# Patient Record
Sex: Female | Born: 1990 | Race: White | Hispanic: No | Smoking: Current every day smoker
Health system: Southern US, Community
[De-identification: ages and names within clinical notes are randomized; demographics above are authoritative.]

## PROBLEM LIST (undated history)

## (undated) DIAGNOSIS — I10 Essential (primary) hypertension: Secondary | ICD-10-CM

## (undated) DIAGNOSIS — F1110 Opioid abuse, uncomplicated: Secondary | ICD-10-CM

## (undated) DIAGNOSIS — F319 Bipolar disorder, unspecified: Secondary | ICD-10-CM

## (undated) DIAGNOSIS — F419 Anxiety disorder, unspecified: Secondary | ICD-10-CM

## (undated) DIAGNOSIS — N289 Disorder of kidney and ureter, unspecified: Secondary | ICD-10-CM

---
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
## 2010-03-13 ENCOUNTER — Encounter: Payer: Self-pay | Admitting: Gastroenterology

## 2014-02-20 HISTORY — PX: KIDNEY SURGERY: SHX687

## 2016-12-09 ENCOUNTER — Encounter: Payer: Self-pay | Admitting: Emergency Medicine

## 2016-12-09 ENCOUNTER — Emergency Department (HOSPITAL_COMMUNITY)
Admission: EM | Admit: 2016-12-09 | Discharge: 2016-12-11 | Disposition: A | Discharge status: Other Acute Inpatient Hospital | Payer: Medicaid - Out of State | Attending: Emergency Medicine | Admitting: Emergency Medicine

## 2016-12-09 DIAGNOSIS — Z3A28 28 weeks gestation of pregnancy: Secondary | ICD-10-CM | POA: Diagnosis not present

## 2016-12-09 DIAGNOSIS — Z046 Encounter for general psychiatric examination, requested by authority: Secondary | ICD-10-CM | POA: Insufficient documentation

## 2016-12-09 DIAGNOSIS — O99323 Drug use complicating pregnancy, third trimester: Secondary | ICD-10-CM | POA: Diagnosis not present

## 2016-12-09 DIAGNOSIS — F1920 Other psychoactive substance dependence, uncomplicated: Secondary | ICD-10-CM | POA: Diagnosis not present

## 2016-12-09 DIAGNOSIS — R45851 Suicidal ideations: Secondary | ICD-10-CM | POA: Insufficient documentation

## 2016-12-09 DIAGNOSIS — R000 Tachycardia, unspecified: Secondary | ICD-10-CM | POA: Insufficient documentation

## 2016-12-09 DIAGNOSIS — F419 Anxiety disorder, unspecified: Secondary | ICD-10-CM | POA: Insufficient documentation

## 2016-12-09 DIAGNOSIS — F322 Major depressive disorder, single episode, severe without psychotic features: Secondary | ICD-10-CM | POA: Diagnosis not present

## 2016-12-09 DIAGNOSIS — F1194 Opioid use, unspecified with opioid-induced mood disorder: Secondary | ICD-10-CM | POA: Insufficient documentation

## 2016-12-09 DIAGNOSIS — F1910 Other psychoactive substance abuse, uncomplicated: Secondary | ICD-10-CM

## 2016-12-09 DIAGNOSIS — M797 Fibromyalgia: Secondary | ICD-10-CM | POA: Diagnosis not present

## 2016-12-09 DIAGNOSIS — F332 Major depressive disorder, recurrent severe without psychotic features: Secondary | ICD-10-CM | POA: Diagnosis not present

## 2016-12-09 DIAGNOSIS — F17210 Nicotine dependence, cigarettes, uncomplicated: Secondary | ICD-10-CM | POA: Insufficient documentation

## 2016-12-09 DIAGNOSIS — I10 Essential (primary) hypertension: Secondary | ICD-10-CM | POA: Insufficient documentation

## 2016-12-09 DIAGNOSIS — Z3493 Encounter for supervision of normal pregnancy, unspecified, third trimester: Secondary | ICD-10-CM

## 2016-12-09 DIAGNOSIS — F329 Major depressive disorder, single episode, unspecified: Secondary | ICD-10-CM | POA: Diagnosis present

## 2016-12-09 HISTORY — DX: Anxiety disorder, unspecified: F41.9

## 2016-12-09 HISTORY — DX: Opioid abuse, uncomplicated: F11.10

## 2016-12-09 HISTORY — DX: Essential (primary) hypertension: I10

## 2016-12-09 HISTORY — DX: Bipolar disorder, unspecified: F31.9

## 2016-12-09 HISTORY — DX: Disorder of kidney and ureter, unspecified: N28.9

## 2016-12-09 LAB — COMPREHENSIVE METABOLIC PANEL
ALT: 19 U/L (ref 14–54)
AST: 24 U/L (ref 15–41)
Albumin: 3.1 g/dL — ABNORMAL LOW (ref 3.5–5.0)
Alkaline Phosphatase: 75 U/L (ref 38–126)
Anion gap: 11 (ref 5–15)
BUN: <5 mg/dL — ABNORMAL LOW (ref 6–20)
CO2: 20 mmol/L — ABNORMAL LOW (ref 22–32)
Calcium: 8.5 mg/dL — ABNORMAL LOW (ref 8.9–10.3)
Chloride: 106 mmol/L (ref 101–111)
Creatinine, Ser: 0.71 mg/dL (ref 0.44–1.00)
GFR calc Af Amer: >60 mL/min (ref >60)
GFR calc non Af Amer: >60 mL/min (ref >60)
Glucose, Bld: 106 mg/dL — ABNORMAL HIGH (ref 65–99)
Potassium: 2.9 mmol/L — ABNORMAL LOW (ref 3.5–5.1)
Sodium: 137 mmol/L (ref 135–145)
Total Bilirubin: 1.1 mg/dL (ref 0.3–1.2)
Total Protein: 6.6 g/dL (ref 6.5–8.1)

## 2016-12-09 LAB — URINALYSIS, ROUTINE W REFLEX MICROSCOPIC
Bacteria, UA: NONE SEEN
Bilirubin Urine: NEGATIVE
Glucose, UA: NEGATIVE mg/dL
Hgb urine dipstick: NEGATIVE
Ketones, ur: 20 mg/dL — AB
Leukocytes, UA: NEGATIVE
Nitrite: NEGATIVE
Protein, ur: 100 mg/dL — AB
Specific Gravity, Urine: 1.018 (ref 1.005–1.030)
pH: 7 (ref 5.0–8.0)

## 2016-12-09 LAB — RAPID URINE DRUG SCREEN, HOSP PERFORMED
Amphetamines: NOT DETECTED
Barbiturates: NOT DETECTED
Benzodiazepines: POSITIVE — AB
Cocaine: NOT DETECTED
Opiates: POSITIVE — AB
Tetrahydrocannabinol: POSITIVE — AB

## 2016-12-09 LAB — ACETAMINOPHEN LEVEL: Acetaminophen (Tylenol), Serum: <10 ug/mL — ABNORMAL LOW (ref 10–30)

## 2016-12-09 LAB — CBC
HCT: 34.7 % — ABNORMAL LOW (ref 36.0–46.0)
Hemoglobin: 12.5 g/dL (ref 12.0–15.0)
MCH: 32.9 pg (ref 26.0–34.0)
MCHC: 36 g/dL (ref 30.0–36.0)
MCV: 91.3 fL (ref 78.0–100.0)
Platelets: 323 10*3/uL (ref 150–400)
RBC: 3.8 MIL/uL — ABNORMAL LOW (ref 3.87–5.11)
RDW: 13.4 % (ref 11.5–15.5)
WBC: 10.7 10*3/uL — ABNORMAL HIGH (ref 4.0–10.5)

## 2016-12-09 LAB — ETHANOL: Alcohol, Ethyl (B): <10 mg/dL (ref <10)

## 2016-12-09 LAB — SALICYLATE LEVEL: Salicylate Lvl: <7 mg/dL (ref 2.8–30.0)

## 2016-12-09 MED ORDER — SODIUM CHLORIDE 0.9 % IV BOLUS (SEPSIS)
1000.0000 mL | Freq: Once | INTRAVENOUS | Status: AC
Start: 2016-12-09 — End: 2016-12-09
  Administered 2016-12-09: 1000 mL via INTRAVENOUS

## 2016-12-09 MED ORDER — LORAZEPAM 2 MG/ML IJ SOLN
1.0000 mg | Freq: Once | INTRAMUSCULAR | Status: AC
  Administered 2016-12-09: 1 mg via INTRAVENOUS
  Filled 2016-12-09: qty 1

## 2016-12-09 MED ORDER — TRAZODONE HCL 50 MG PO TABS
50.0000 mg | ORAL_TABLET | Freq: Every day | ORAL | Status: DC
  Administered 2016-12-09 – 2016-12-10 (×2): 50 mg via ORAL
  Filled 2016-12-09 (×2): qty 1

## 2016-12-09 MED ORDER — COMPLETENATE 29-1 MG PO CHEW
1.0000 | CHEWABLE_TABLET | Freq: Every day | ORAL | Status: DC
  Filled 2016-12-09 (×2): qty 1

## 2016-12-09 MED ORDER — LORAZEPAM 1 MG PO TABS
1.0000 mg | ORAL_TABLET | Freq: Three times a day (TID) | ORAL | Status: DC | PRN
  Administered 2016-12-09 – 2016-12-10 (×4): 1 mg via ORAL
  Filled 2016-12-09 (×4): qty 1

## 2016-12-09 MED ORDER — GABAPENTIN 600 MG PO TABS
600.0000 mg | ORAL_TABLET | Freq: Three times a day (TID) | ORAL | Status: DC
  Filled 2016-12-09: qty 1

## 2016-12-09 MED ORDER — ACETAMINOPHEN 500 MG PO TABS
500.0000 mg | ORAL_TABLET | Freq: Four times a day (QID) | ORAL | Status: DC | PRN
  Administered 2016-12-10 – 2016-12-11 (×2): 500 mg via ORAL
  Filled 2016-12-09 (×2): qty 1

## 2016-12-09 MED ORDER — ONDANSETRON 8 MG PO TBDP
8.0000 mg | ORAL_TABLET | Freq: Three times a day (TID) | ORAL | Status: DC | PRN
  Administered 2016-12-09 – 2016-12-11 (×5): 8 mg via ORAL
  Filled 2016-12-09 (×5): qty 1

## 2016-12-09 MED ORDER — POTASSIUM CHLORIDE CRYS ER 20 MEQ PO TBCR: 40.0000 meq | EXTENDED_RELEASE_TABLET | Freq: Once | ORAL | Status: DC

## 2016-12-09 MED ORDER — GABAPENTIN 300 MG PO CAPS
600.0000 mg | ORAL_CAPSULE | Freq: Three times a day (TID) | ORAL | Status: DC
  Administered 2016-12-09 – 2016-12-11 (×6): 600 mg via ORAL
  Filled 2016-12-09 (×6): qty 2

## 2016-12-09 MED ORDER — POTASSIUM CHLORIDE 10 MEQ/100ML IV SOLN
10.0000 meq | Freq: Once | INTRAVENOUS | Status: AC
  Administered 2016-12-09: 10 meq via INTRAVENOUS
  Filled 2016-12-09: qty 100

## 2016-12-09 MED ORDER — DIPHENHYDRAMINE HCL 25 MG PO CAPS
50.0000 mg | ORAL_CAPSULE | Freq: Four times a day (QID) | ORAL | Status: DC | PRN
  Administered 2016-12-09 – 2016-12-11 (×6): 50 mg via ORAL
  Filled 2016-12-09 (×6): qty 2

## 2016-12-09 MED ORDER — POTASSIUM CHLORIDE CRYS ER 20 MEQ PO TBCR
40.0000 meq | EXTENDED_RELEASE_TABLET | Freq: Once | ORAL | Status: AC
  Administered 2016-12-09: 40 meq via ORAL
  Filled 2016-12-09: qty 2

## 2016-12-09 NOTE — BH Assessment (Signed)
[facility] Assessment Progress Note  Case was staffed with Lord DNP who recommended a inpatient admission as appropriate bed placement is investigated.         

## 2016-12-09 NOTE — ED Notes (Signed)
Theodoro GristDave with TTS in speaking with pt.

## 2016-12-09 NOTE — ED Notes (Signed)
Bed: WA30 Expected date:  Expected time:  Means of arrival:  Comments: 

## 2016-12-09 NOTE — Progress Notes (Addendum)
Spoke with Dr. Jolayne Pantheronstant. Pt is a G3P2 AT [redacted] weeks gestation here because she is withdrawing off of heroin. Pt says the last time she used heroin was 2 days ago. Says she took methadone yesterday. Pt says she last saw an OB GYN about a month ago when [place] was in [place] Says she was told then that she has a placenta previa. Pt says she had spotting yesterday and this morning. Scant amt of dried blood noted on pt's underwear. No leaking of fluid. FHR tracing is a category 1 with no uc's. Labs have been done CBC, CMP, cath urine for U/A and drug screen. Potassium is 2.9 and she is getting IV potassium. Pt had preeclampsia with her other pregnancies. She was induced with her 1st baby three weeks Don[place] Range and she had preterm labor with her 2nd baby and delivered six weeks Cleston Lautner. Dr. Jolayne Pantheronstant spoke with Dr. Silverio LayYao concerning the pt's plan of care. Pt is OB cleared.

## 2016-12-09 NOTE — ED Notes (Signed)
Patient requesting clonidine for withdrawal. Md notified

## 2016-12-09 NOTE — ED Provider Notes (Signed)
[facility]-EMERGENCY DEPT Provider Note   CSN: [id number] Arrival date & time: 12/09/16  0751     History   Chief Complaint Chief Complaint  Patient presents with  . Suicidal  . Withdrawal    HPI Tiffany KocherSamantha Sloane is a 26 y.o. female history of anxiety, depression, chronic heroin and oxycodone abuse, previous preeclampsia, 7 months pregnant who presented with drug abuse, possible withdrawal.  Patient states that she is 7 months pregnant by dates and had been followed up with an OB down at [place].  She has been getting prenatal care but has not been following up that much.  She states that she was prescribed Klonopin, oxycodone but ran out about a month ago so she started taking her husband's prescriptions.  Patient then became very depressed and left [place] several days ago and came up here to be with her father.  She states that about 2 days ago, she relapsed with heroin and injected some heroin.  She also took some methadone yesterday.  She states that she does not want to live anymore and she was thinking of standing in the middle of the surgery and be hit by a car.  She also has some palpitations and some subjective shortness of breath.  Moreover, patient states that she may have previous preeclampsia during her second pregnancy and she was hospitalized and required a early C-section for that pregnancy.  Patient has some lower abdominal cramps but denies any leakage of fluid.   The history is provided by the patient and a parent.    Past Medical History:  Diagnosis Date  . Anxiety   . Bipolar 1 disorder, depressed (HCC)   . Heroin abuse (HCC)   . Hypertension   . Renal disorder     There are no active problems to display for this patient.   Past Surgical History:  Procedure Laterality Date  . KIDNEY SURGERY Right 2016    OB History    Gravida Para Term Preterm AB Living   1             SAB TAB Ectopic Multiple Live Births                    Home Medications    Prior to Admission medications   Not on File    Family History No family history on file.  Social History Social History  Substance Use Topics  . Smoking status: Current Every Day Smoker    Packs/day: 1.00    Types: Cigarettes  . Smokeless tobacco: Never Used  . Alcohol use No     Allergies   Patient has no allergy information on record.   Review of Systems Review of Systems  Constitutional: Positive for appetite change.  Gastrointestinal: Positive for abdominal pain.  Neurological: Positive for light-headedness.  All other systems reviewed and are negative.    Physical Exam Updated Vital Signs BP 120/79 (BP Location: Left Arm)   Pulse (!) 103   Temp 98.7 F (37.1 C) (Oral)   Resp 18   Ht 5\' 5"  (1.651 m)   Wt 92.1 kg (203 lb)   SpO2 99%   BMI 33.78 kg/m   Physical Exam  Constitutional:  Anxious   HENT:  Head: Normocephalic.  MM dry   Eyes: Pupils are equal, round, and reactive to light. Conjunctivae and EOM are normal.  Neck: Normal range of motion. Neck supple.  Cardiovascular: Regular rhythm and normal heart sounds.   Tachycardic  Pulmonary/Chest: Effort normal and breath sounds normal. No respiratory distress. She has no wheezes. She has no rales.  Abdominal:  Gravid uterus, nontender   Musculoskeletal: Normal range of motion.  Neurological: She is alert.  Skin: Skin is warm.  Psychiatric:  Depressed   Nursing note and vitals reviewed.    ED Treatments / Results  Labs (all labs ordered are listed, but only abnormal results are displayed) Labs Reviewed  CBC - Abnormal; Notable for the following:       Result Value   WBC 10.7 (*)    RBC 3.80 (*)    HCT 34.7 (*)    All other components within normal limits  COMPREHENSIVE METABOLIC PANEL  ETHANOL  SALICYLATE LEVEL  ACETAMINOPHEN LEVEL  RAPID URINE DRUG SCREEN, HOSP PERFORMED    EKG  EKG Interpretation None       Radiology No results  found.  Procedures Procedures (including critical care time)  Medications Ordered in ED Medications  sodium chloride 0.9 % bolus 1,000 mL (1,000 mLs Intravenous New Bag/Given 12/09/16 0859)     Initial Impression / Assessment and Plan / ED Course  I have reviewed the triage vital signs and the nursing notes.  Pertinent labs & imaging results that were available during my care of the patient were reviewed by me and considered in my medical decision making (see chart for details).    Syrina Wake is a 26 y.o. female here with suicidal ideation, 7 months pregnant, possible heroin withdrawal. Will check labs, tox, UDS. Rapid OB at bedside to assess patient and put patient on fetal and toco monitor. Will consult OB and TTS.   10:01 AM I talked to Dr. Jolayne Panther from [facility]. She states that they can follow patient after she gets discharged from rehab and can continue medicines from rehab but won't be able to start anything. The FHR is reassuring, no contractions on toco so rapid OB signed off. K 2.9, supplemented. Medically cleared now. Will consult TTS and call psych for meds for opioid withdrawal in a pregnant patient.      Final Clinical Impressions(s) / ED Diagnoses   Final diagnoses:  None    New Prescriptions New Prescriptions   No medications on file     Charlynne Pander, MD 12/09/16 1325

## 2016-12-09 NOTE — ED Triage Notes (Addendum)
Patient presents with father who states pt is 7 months pregnant and is coming off of heroin and oxy. Last use heroin 2 days ago. Pt reports methadone yesterday. Patient reports thoughts of harming herself while coming off of medication. States "I would rather not be here." Pt states has not seen an OBGYN in about 2 months.

## 2016-12-09 NOTE — Progress Notes (Signed)
Pt is a G3P2 at [redacted] weeks gestation here because she is withdrawing  From heroin. Says she last used heroin 2 days ago. Says she took methadone yesterday, Says she has been taking roxies. Says she last saw an OB GYN about a month ago while she was living in [place]. Says she has a placenta previa. Scant amt of dried blood noted on pt's underwear. Says she had preeclampsia with her previous pregnancies and she was induced with her first child. He was 3 weeks Sheresa Cullop. Says she had preterm labor with her 2nd baby and she was 6 weeks Lessa Huge. 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 Pt says she has had seizures. Is not taking any meds for them. Thinks she had one yesterday. Says family member told her that she sat and stared for about 4 min. Denies any leaking of fluid. Says she has had thoughts of harming herself. Says she has anxiety and depression. Says she was taking xanax, but it was dc'd 4 months into the pregnancy and she was started on Clonopin. She does not remember the last time she took Clonopin. Pt  Is calm at this time. Cooperative.

## 2016-12-09 NOTE — ED Notes (Signed)
Rapid OB called  

## 2016-12-09 NOTE — ED Provider Notes (Addendum)
Requesting medicine for nausea. Zofran ordered   Doug SouJacubowitz, Sheenah Dimitroff, MD 12/09/16 1629 6 PM patient reports that she feels as if she is withdrawing from drugs. On exam she appears calm and in no distress.I Had a lengthy conversation with the hospital pharmacist.she can be treated with Benadryl for symptomatic relief. We will continue benzodiazepine to avoid withdrawal from benzodiazepines. potassium supplementation ordered by Thresa Rossr.Yao   Tayli Buch, MD 12/09/16 [phone]

## 2016-12-09 NOTE — ED Notes (Signed)
Bed: [facility] Expected date:  Expected time:  Means of arrival:  Comments: 
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
## 2016-12-09 NOTE — ED Notes (Signed)
ASSUMED CARE OF PT. AAOX4. PT IN NO APPARENT DISTRESS OR PAIN. WILL CONTINUE TO MONITOR. 

## 2016-12-10 ENCOUNTER — Encounter (HOSPITAL_COMMUNITY): Payer: Self-pay | Admitting: Emergency Medicine

## 2016-12-10 DIAGNOSIS — F1920 Other psychoactive substance dependence, uncomplicated: Secondary | ICD-10-CM | POA: Diagnosis not present

## 2016-12-10 DIAGNOSIS — Z91410 Personal history of adult physical and sexual abuse: Secondary | ICD-10-CM

## 2016-12-10 DIAGNOSIS — M797 Fibromyalgia: Secondary | ICD-10-CM

## 2016-12-10 DIAGNOSIS — R45851 Suicidal ideations: Secondary | ICD-10-CM | POA: Diagnosis not present

## 2016-12-10 DIAGNOSIS — Z91411 Personal history of adult psychological abuse: Secondary | ICD-10-CM

## 2016-12-10 DIAGNOSIS — R000 Tachycardia, unspecified: Secondary | ICD-10-CM

## 2016-12-10 DIAGNOSIS — M549 Dorsalgia, unspecified: Secondary | ICD-10-CM | POA: Diagnosis not present

## 2016-12-10 DIAGNOSIS — Z3A29 29 weeks gestation of pregnancy: Secondary | ICD-10-CM

## 2016-12-10 DIAGNOSIS — O99323 Drug use complicating pregnancy, third trimester: Secondary | ICD-10-CM | POA: Diagnosis not present

## 2016-12-10 DIAGNOSIS — F1194 Opioid use, unspecified with opioid-induced mood disorder: Secondary | ICD-10-CM | POA: Diagnosis not present

## 2016-12-10 DIAGNOSIS — F332 Major depressive disorder, recurrent severe without psychotic features: Secondary | ICD-10-CM | POA: Diagnosis present

## 2016-12-10 DIAGNOSIS — F17210 Nicotine dependence, cigarettes, uncomplicated: Secondary | ICD-10-CM | POA: Diagnosis not present

## 2016-12-10 MED ORDER — SERTRALINE HCL 50 MG PO TABS
50.0000 mg | ORAL_TABLET | Freq: Every day | ORAL | Status: DC
  Administered 2016-12-10 – 2016-12-11 (×2): 50 mg via ORAL
  Filled 2016-12-10 (×2): qty 1

## 2016-12-10 MED ORDER — LORAZEPAM 1 MG PO TABS
0.0000 mg | ORAL_TABLET | Freq: Four times a day (QID) | ORAL | Status: DC
Start: 2016-12-10 — End: 2016-12-11
  Administered 2016-12-11: 1 mg via ORAL
  Administered 2016-12-11 (×2): 3 mg via ORAL
  Filled 2016-12-10: qty 1
  Filled 2016-12-10 (×2): qty 3

## 2016-12-10 MED ORDER — LORAZEPAM 1 MG PO TABS: 0.0000 mg | ORAL_TABLET | Freq: Two times a day (BID) | ORAL | Status: DC

## 2016-12-10 MED ORDER — LORAZEPAM 2 MG/ML IJ SOLN
0.0000 mg | Freq: Four times a day (QID) | INTRAMUSCULAR | Status: DC
Start: 2016-12-10 — End: 2016-12-11

## 2016-12-10 MED ORDER — COMPLETENATE 29-1 MG PO CHEW
1.0000 | CHEWABLE_TABLET | Freq: Every day | ORAL | Status: DC
  Administered 2016-12-10 – 2016-12-11 (×2): 1 via ORAL
  Filled 2016-12-10 (×2): qty 1

## 2016-12-10 MED ORDER — LORAZEPAM 2 MG/ML IJ SOLN
0.0000 mg | Freq: Two times a day (BID) | INTRAMUSCULAR | Status: DC
Start: 2016-12-13 — End: 2016-12-11

## 2016-12-10 NOTE — ED Notes (Signed)
Bed: WA19 Expected date:  Expected time:  Means of arrival:  Comments: 

## 2016-12-10 NOTE — Patient Outreach (Signed)
ED Peer Support Specialist Patient Intake (Complete at intake & 30-60 Day Follow-up)  Name: Lindsay KocherSamantha Maita  MRN: [id number]  Age: 26 y.o.   Date of Admission: 12/10/2016  Intake: Initial Comments:      Primary Reason Admitted: PTSD, poly substance use with Benzodiazepines, Marijuana, and Opioids  Lab values: Alcohol/ETOH: Negative Positive UDS? Yes Amphetamines: No Barbiturates: No Benzodiazepines: Yes Cocaine: No Opiates: Yes Cannabinoids: Yes  Demographic information: Gender: Female Ethnicity: White Marital Status: Married Insurance Status: Patent attorneyMedicaid Receives non-medical governmental assistance (Work Engineer, agriculturalirst/Welfare, Sales executivefood stamps, etc.: Yes ([employer] Health[employer] stamps) Lives with: Parent (Dad) Living situation: House/Apartment  Reported Patient History: Patient reported health conditions: Depression, Anxiety disorders (PTSD, 7 months pregnant) Patient aware of HIV and hepatitis status: Yes (comment) (Negative)  In past year, has patient visited ED for any reason? Yes  Number of ED visits: 1  Reason(s) for visit: stomache pains with being pregant  In past year, has patient been hospitalized for any reason? Yes  Number of hospitalizations: 1  Reason(s) for hospitalization: Pneumonia  In past year, has patient been arrested? No  Number of arrests:    Reason(s) for arrest:    In past year, has patient been incarcerated? No  Number of incarcerations:    Reason(s) for incarceration:    In past year, has patient received medication-assisted treatment? No  In past year, patient received the following treatments:    In past year, has patient received any harm reduction services? No  Did this include any of the following?    In past year, has patient received care from a mental health provider for diagnosis other than SUD? No  In past year, is this first time patient has overdosed? No  Number of past overdoses: 0  In past year, is this first time patient has been hospitalized for  an overdose? No  Number of hospitalizations for overdose(s): 0  Is patient currently receiving treatment for a mental health diagnosis? No  Patient reports experiencing difficulty participating in SUD treatment: Yes    Most important reason(s) for this difficulty? Treatment access, Physical health problems (7 months pregnant)  Has patient received prior services for treatment? No  In past, patient has received services from following agencies:    Plan of Care:  Suggested follow up at these agencies/treatment centers:  ([place]patient [facility] the [place] [facility] psychiatric and substance use treatment/rehabilitation unit)  Other information:    Bartholomew BoardsJohn Jayro Mcmath, CPSS  12/10/2016 3:25 PM

## 2016-12-10 NOTE — ED Notes (Signed)
Pt states she has a history of aspiration pna and was on thickened liquids. And a history of endometriosis.

## 2016-12-10 NOTE — ED Notes (Signed)
Pt ambulating in hall with sitter, feels anxious after speaking with her children, meds given at her request.

## 2016-12-10 NOTE — ED Provider Notes (Signed)
Asked to review order of Benzos for this pregnant pt. She does have reported h/o benzo use, which is high risk for pregnancy. Will place on CIWA in place of scheduled benzos in effort to assess total need/dosages, so that pt can be weaned for detox.    Shaune PollackIsaacs, Dawnita Molner, MD 12/11/16 [phone]

## 2016-12-10 NOTE — ED Notes (Signed)
Bed: WA26 Expected date:  Expected time:  Means of arrival:  Comments: 

## 2016-12-10 NOTE — ED Notes (Signed)
1 bag of belongings in locker 26
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
## 2016-12-11 DIAGNOSIS — F17210 Nicotine dependence, cigarettes, uncomplicated: Secondary | ICD-10-CM

## 2016-12-11 DIAGNOSIS — R450 Nervousness: Secondary | ICD-10-CM

## 2016-12-11 DIAGNOSIS — F332 Major depressive disorder, recurrent severe without psychotic features: Secondary | ICD-10-CM

## 2016-12-11 DIAGNOSIS — R45851 Suicidal ideations: Secondary | ICD-10-CM | POA: Diagnosis not present

## 2016-12-11 DIAGNOSIS — F419 Anxiety disorder, unspecified: Secondary | ICD-10-CM

## 2016-12-11 DIAGNOSIS — F1194 Opioid use, unspecified with opioid-induced mood disorder: Secondary | ICD-10-CM

## 2016-12-11 DIAGNOSIS — F1920 Other psychoactive substance dependence, uncomplicated: Secondary | ICD-10-CM

## 2016-12-11 LAB — BASIC METABOLIC PANEL
Anion gap: 9 (ref 5–15)
BUN: 6 mg/dL (ref 6–20)
CO2: 19 mmol/L — ABNORMAL LOW (ref 22–32)
Calcium: 8.6 mg/dL — ABNORMAL LOW (ref 8.9–10.3)
Chloride: 108 mmol/L (ref 101–111)
Creatinine, Ser: 0.69 mg/dL (ref 0.44–1.00)
GFR calc Af Amer: >60 mL/min (ref >60)
GFR calc non Af Amer: >60 mL/min (ref >60)
Glucose, Bld: 98 mg/dL (ref 65–99)
Potassium: 3.1 mmol/L — ABNORMAL LOW (ref 3.5–5.1)
Sodium: 136 mmol/L (ref 135–145)

## 2016-12-11 NOTE — BH Assessment (Addendum)
[facility] Assessment Progress Note  Per Thedore MinsMojeed Akintayo, MD, this pt requires psychiatric hospitalization at this time.  At 13:02 [facility] calls to report that pt has been accepted to their facility by Dr Caryn SectionMary Kimmel to Rm [id number], conditioned upon pt being placed under IVC.  EDP Gwyneth SproutWhitney Plunkett, MD concurs with this decision, and has initiated IVC.  [place] [facility]  As of this writing, confirmation of receipt and service of Findings and Custody Order are pending.  Pt's nurse, Adela LankJacqueline, has been notified, and agrees to call report to [phone], opt. 2.  [facility]  They are to take pt to the Emergency Department.  Doylene Canninghomas Lyal Husted, MA Triage Specialist [phone]  Addendum:  At 14:49 Magistrate Maisie Fushomas confirms receipt of IVC documents.  Service of Findings and Custody Order is still pending.  Doylene Canninghomas Jabir Dahlem, MA Triage Specialist [phone]
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
## 2016-12-11 NOTE — Progress Notes (Signed)
Sheriff called for transport. They stated it may be this afternoon or after 1900. Will continue to monitor patient.
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
## 2016-12-12 ENCOUNTER — Ambulatory Visit (HOSPITAL_COMMUNITY): Payer: Self-pay

## 2016-12-26 ENCOUNTER — Telehealth: Payer: Self-pay | Admitting: General Practice

## 2016-12-26 NOTE — Telephone Encounter (Signed)
Attempted to call patient on Home and cell phone in regards to New OB appointment on 12/28/16 at 9:20am.  Left VM on home phone and cell for patient to call office if unable to keep this appointment.

## 2016-12-28 ENCOUNTER — Encounter: Payer: Self-pay | Admitting: Obstetrics & Gynecology

## 2020-03-08 ENCOUNTER — Other Ambulatory Visit: Payer: Self-pay

## 2020-03-08 ENCOUNTER — Encounter (HOSPITAL_BASED_OUTPATIENT_CLINIC_OR_DEPARTMENT_OTHER): Payer: Self-pay | Admitting: *Deleted

## 2020-03-08 ENCOUNTER — Emergency Department (HOSPITAL_BASED_OUTPATIENT_CLINIC_OR_DEPARTMENT_OTHER)
Admission: EM | Admit: 2020-03-08 | Discharge: 2020-03-08 | Disposition: A | Discharge status: Home / Self Care | Payer: Medicaid - Out of State | Attending: Emergency Medicine | Admitting: Emergency Medicine

## 2020-03-08 DIAGNOSIS — R000 Tachycardia, unspecified: Secondary | ICD-10-CM | POA: Insufficient documentation

## 2020-03-08 DIAGNOSIS — F17210 Nicotine dependence, cigarettes, uncomplicated: Secondary | ICD-10-CM | POA: Diagnosis not present

## 2020-03-08 DIAGNOSIS — Z20822 Contact with and (suspected) exposure to covid-19: Secondary | ICD-10-CM | POA: Insufficient documentation

## 2020-03-08 DIAGNOSIS — R21 Rash and other nonspecific skin eruption: Secondary | ICD-10-CM | POA: Diagnosis not present

## 2020-03-08 DIAGNOSIS — I10 Essential (primary) hypertension: Secondary | ICD-10-CM | POA: Insufficient documentation

## 2020-03-08 DIAGNOSIS — B349 Viral infection, unspecified: Secondary | ICD-10-CM | POA: Diagnosis not present

## 2020-03-08 DIAGNOSIS — R61 Generalized hyperhidrosis: Secondary | ICD-10-CM | POA: Diagnosis not present

## 2020-03-08 DIAGNOSIS — R059 Cough, unspecified: Secondary | ICD-10-CM | POA: Diagnosis present

## 2020-03-08 MED ORDER — PREDNISONE 20 MG PO TABS: ORAL_TABLET | ORAL | 0 refills | Status: DC

## 2020-03-08 MED ORDER — PREDNISONE 50 MG PO TABS
60.0000 mg | ORAL_TABLET | Freq: Once | ORAL | Status: AC
  Administered 2020-03-08: 60 mg via ORAL
  Filled 2020-03-08: qty 1

## 2020-03-08 NOTE — ED Triage Notes (Addendum)
C/o scattered rash, fever , pro cough  x 2 days, PTA tylenol

## 2020-03-08 NOTE — ED Provider Notes (Signed)
[facility] EMERGENCY DEPARTMENT Provider Note   CSN: [id number] Arrival date & time: 03/08/20  1554     History Chief Complaint  Patient presents with  . Rash    Lindsay Miranda is a 30 y.o. female.  Patient with history of IV drug use, 1 kidney --presents to the emergency department for various complaints.  Patient states that she has had sweats, congestion, cough, subjective fevers and chills especially at night over the past 2 to 3 days.  She has associated body aches and headache.  She denies known sick contacts.  She is not vaccinated against COVID.  Those symptoms started first but over the past 2 days patient has had a rash on her right lower extremity on the posterior aspect of the leg that is burning and itchy in nature.  She states that she developed a rash in other places which is more mild including her arms and under her left breast.  She has been taking Tylenol at home, last just prior to arrival.  She has also taken Benadryl for her symptoms.        Past Medical History:  Diagnosis Date  . Anxiety   . Bipolar 1 disorder, depressed ([facility])   . Heroin abuse ([facility])   . Hypertension   . Renal disorder     Patient Active Problem List   Diagnosis Date Noted  . Polysubstance dependence including opioid type drug with complication, episodic abuse, with unspecified complication ([facility])   . MDD (major depressive disorder), recurrent severe, without psychosis ([facility])   . Opioid-induced mood disorder [facility])     Past Surgical History:  Procedure Laterality Date  . KIDNEY SURGERY Right 2016     OB History    Gravida  1   Para      Term      Preterm      AB      Living        SAB      IAB      Ectopic      Multiple      Live Births              No family history on file.  Social History   Tobacco Use  . Smoking status: Current Every Day Smoker    Packs/day: 1.00    Types: Cigarettes  . Smokeless tobacco: Never Used  Substance Use  Topics  . Alcohol use: No  . Drug use: Yes    Home Medications Prior to Admission medications   Medication Sig Start Date End Date Taking? Authorizing Provider  gabapentin (NEURONTIN) 600 MG tablet Take 600 mg by mouth 3 (three) times daily.    [provider]  ondansetron (ZOFRAN-ODT) 8 MG disintegrating tablet Take 8 mg by mouth every 8 (eight) hours as needed for nausea or vomiting.    [provider]  Oxycodone HCl 10 MG TABS Take 5-10 mg by mouth 3 (three) times daily. Pt would take 10 mg in the morning + night and 5 mg mid day.    [provider]  Prenatal MV-Min-FA-Omega-3 (PRENATAL GUMMIES/DHA & FA PO) Take 2 each by mouth daily.    [provider]  traZODone (DESYREL) 50 MG tablet Take 50-100 mg by mouth at bedtime.    [provider]    Allergies    Risperdal [risperidone] and Suboxone [buprenorphine hcl-naloxone hcl]  Review of Systems   Review of Systems  Constitutional: Positive for chills and fever (subjective).  Negative for fatigue.  HENT: Positive for congestion and sore throat. Negative for ear pain, rhinorrhea and sinus pressure.   Eyes: Negative for redness.  Respiratory: Positive for cough. Negative for shortness of breath and wheezing.   Gastrointestinal: Positive for diarrhea, nausea and vomiting. Negative for abdominal pain.  Genitourinary: Negative for dysuria.  Musculoskeletal: Positive for myalgias. Negative for neck stiffness.  Skin: Positive for rash.  Neurological: Positive for headaches.  Hematological: Negative for adenopathy.    Physical Exam Updated Vital Signs BP (!) 140/93   Pulse (!) 109   Temp 98.5 F (36.9 C)   Resp 18   Ht 5\' 4"  (1.626 m)   Wt 74.8 kg   LMP 03/08/2020   SpO2 99%   Breastfeeding Unknown   BMI 28.32 kg/m   Physical Exam Vitals and nursing note reviewed.  Constitutional:      Appearance: She is well-developed and well-nourished.  HENT:     Head: Normocephalic and  atraumatic.     Jaw: No trismus.     Right Ear: Tympanic membrane, ear canal and external ear normal.     Left Ear: Tympanic membrane, ear canal and external ear normal.     Nose: Nose normal. No mucosal edema or rhinorrhea.     Mouth/Throat:     Mouth: Oropharynx is clear and moist and mucous membranes are normal. Mucous membranes are not dry. No oral lesions.     Pharynx: Uvula midline. No oropharyngeal exudate, posterior oropharyngeal edema, posterior oropharyngeal erythema or uvula swelling.     Tonsils: No tonsillar abscesses.  Eyes:     General:        Right eye: No discharge.        Left eye: No discharge.     Conjunctiva/sclera: Conjunctivae normal.  Cardiovascular:     Rate and Rhythm: Regular rhythm. Tachycardia present.     Heart sounds: Normal heart sounds.  Pulmonary:     Effort: Pulmonary effort is normal. No respiratory distress.     Breath sounds: Normal breath sounds. No wheezing or rales.  Abdominal:     Palpations: Abdomen is soft.     Tenderness: There is no abdominal tenderness.  Musculoskeletal:     Cervical back: Normal range of motion and neck supple.  Lymphadenopathy:     Cervical: No cervical adenopathy.  Skin:    General: Skin is warm and dry.     Comments: Patient with maculopapular rash to the posterior right leg without active drainage or signs of cellulitis.  Patient states that she has scattered areas on her arms and under her left breast.  Possible minimal rash in these areas, however not as pronounced as on the right leg.  Neurological:     Mental Status: She is alert.  Psychiatric:        Mood and Affect: Mood and affect normal.     ED Results / Procedures / Treatments   Labs (all labs ordered are listed, but only abnormal results are displayed) Labs Reviewed  SARS CORONAVIRUS 2 (TAT 6-24 HRS)    EKG None  Radiology No results found.  Procedures Procedures (including critical care time)  Medications Ordered in ED Medications   predniSONE (DELTASONE) tablet 60 mg (has no administration in time range)    ED Course  I have reviewed the triage vital signs and the nursing notes.  Pertinent labs & imaging results that were available during my care of the patient were reviewed by me and considered in my  medical decision making (see chart for details).  Patient seen and examined.  Patient appears well, nontoxic.  No current fever however mildly tachycardic.  No chest pain or murmur.  She does have a rash on her right leg.  This would be concerning for shingles however she reports rash in other places which would make shingles less likely.  I have low concern for sepsis or disseminated shingles at this point.  She does have symptoms which are very concerning for COVID infection and she will be tested.  She is in agreement with this.  Vital signs reviewed and are as follows: BP (!) 140/93   Pulse (!) 109   Temp 98.5 F (36.9 C)   Resp 18   Ht 5\' 4"  (1.626 m)   Wt 74.8 kg   LMP 03/08/2020   SpO2 99%   Breastfeeding Unknown   BMI 28.32 kg/m   We did discuss signs and symptoms which should cause her to return.  If she did get worse, especially if she tested negative for COVID, I would consider other systemic testing.  However patient looks well at the current time and that will signs are reassuring.  No hypoxia or respiratory distress.  She will be started on a course of steroids for the rash and she will continue use of oral antihistamines as needed.    MDM Rules/Calculators/A&P                          Detailed discussion had with with patient regarding COVID-19 precautions and written instructions given as well.  We discussed need to isolate themselves for 5 days from onset of symptoms and have 24 hours of improvement prior to breaking isolation.  We discussed that when breaking isolation, mask wearing for 5 additional days is required.  We discussed signs symptoms to return which include worsening shortness of  breath, trouble breathing, or increased work of breathing.  Also return with persistent vomiting, confusion, passing out, or if they have any other concerns. Counseled on the need for rest and good hydration. Discussed that high-risk contacts should be aware of positive result and they need to quarantine and be tested if they develop any symptoms. Patient verbalizes understanding.   Rotunda Worden was evaluated in Emergency Department on 03/08/2020 for the symptoms described in the history of present illness. She was evaluated in the context of the global COVID-19 pandemic, which necessitated consideration that the patient might be at risk for infection with the SARS-CoV-2 virus that causes COVID-19. Institutional protocols and algorithms that pertain to the evaluation of patients at risk for COVID-19 are in a state of rapid change based on information released by regulatory bodies including the CDC and federal and state organizations. These policies and algorithms were followed during the patient's care in the ED.   Final Clinical Impression(s) / ED Diagnoses Final diagnoses:  Viral syndrome  Rash  Encounter for laboratory testing for COVID-19 virus    Rx / DC Orders ED Discharge Orders         Ordered    predniSONE (DELTASONE) 20 MG tablet        03/08/20 1848           03/10/20, PA-C 03/08/20 1855    03/10/20, MD 03/08/20 2315

## 2020-03-08 NOTE — Discharge Instructions (Signed)
Please read and follow all provided instructions.  Your diagnoses today include:  1. Viral syndrome   2. Rash   3. Encounter for laboratory testing for COVID-19 virus     Tests performed today include:  Vital signs. See below for your results today.   COVID test - pending, check mychart for results  Medications prescribed:   Prednisone - steroid medicine   It is best to take this medication in the morning to prevent sleeping problems. If you are diabetic, monitor your blood sugar closely and stop taking Prednisone if blood sugar is over 300. Take with food to prevent stomach upset.   Take any prescribed medications only as directed. Treatment for your infection is aimed at treating the symptoms. There are no medications, such as antibiotics, that will cure your infection.   Home care instructions:  Follow any educational materials contained in this packet.   Your illness is contagious and can be spread to others, especially during the first 3 or 4 days. It cannot be cured by antibiotics or other medicines. Take basic precautions such as washing your hands often, covering your mouth when you cough or sneeze, and avoiding public places where you could spread your illness to others.   Please continue drinking plenty of fluids.  Use over-the-counter medicines as needed as directed on packaging for symptom relief.  You may also use ibuprofen or tylenol as directed on packaging for pain or fever.  Do not take multiple medicines containing Tylenol or acetaminophen to avoid taking too much of this medication.  If you are positive for Covid-19, you should isolate yourself and not be exposed to other people for 5 days after your symptoms began. If you are not feeling better at day 5, you need to isolate yourself for a total of 10 days. If you are feeling better by day 5, you should wear a mask properly, over your nose and mouth, at all times while around other people until 10 days after your  symptoms started.   Follow-up instructions: Please follow-up with your primary care provider as needed for further evaluation of your symptoms if you are not feeling better.   Return instructions:   Please return to the Emergency Department if you experience worsening symptoms.   Return to the emergency department if you have worsening shortness of breath breathing or increased work of breathing, persistent vomiting  RETURN IMMEDIATELY IF you develop shortness of breath, confusion or altered mental status, a new rash, become dizzy, faint, or poorly responsive, or are unable to be cared for at home.  Please return if you have persistent vomiting and cannot keep down fluids or develop a fever that is not controlled by tylenol or motrin.    Please return if you have any other emergent concerns.  Additional Information:  Your vital signs today were: BP (!) 140/93    Pulse (!) 109    Temp 98.5 F (36.9 C)    Resp 18    Ht 5\' 4"  (1.626 m)    Wt 74.8 kg    LMP 03/08/2020    SpO2 99%    Breastfeeding Unknown    BMI 28.32 kg/m  If your blood pressure (BP) was elevated above 135/85 this visit, please have this repeated by your doctor within one month. --------------

## 2020-03-09 LAB — SARS CORONAVIRUS 2 (TAT 6-24 HRS): SARS Coronavirus 2: NEGATIVE

## 2020-03-17 ENCOUNTER — Emergency Department (HOSPITAL_COMMUNITY)
Admission: EM | Admit: 2020-03-17 | Discharge: 2020-03-17 | Discharge status: Absent without leave | Payer: Medicaid - Out of State

## 2020-03-21 ENCOUNTER — Other Ambulatory Visit: Payer: Self-pay

## 2020-03-21 ENCOUNTER — Emergency Department (HOSPITAL_BASED_OUTPATIENT_CLINIC_OR_DEPARTMENT_OTHER)
Admission: EM | Admit: 2020-03-21 | Discharge: 2020-03-22 | Disposition: A | Discharge status: Home / Self Care | Payer: Medicaid - Out of State | Attending: Emergency Medicine | Admitting: Emergency Medicine

## 2020-03-21 ENCOUNTER — Encounter (HOSPITAL_BASED_OUTPATIENT_CLINIC_OR_DEPARTMENT_OTHER): Payer: Self-pay | Admitting: Emergency Medicine

## 2020-03-21 DIAGNOSIS — Z20822 Contact with and (suspected) exposure to covid-19: Secondary | ICD-10-CM | POA: Diagnosis not present

## 2020-03-21 DIAGNOSIS — F1920 Other psychoactive substance dependence, uncomplicated: Secondary | ICD-10-CM | POA: Diagnosis present

## 2020-03-21 DIAGNOSIS — J040 Acute laryngitis: Secondary | ICD-10-CM | POA: Diagnosis not present

## 2020-03-21 DIAGNOSIS — R070 Pain in throat: Secondary | ICD-10-CM | POA: Diagnosis present

## 2020-03-21 DIAGNOSIS — I10 Essential (primary) hypertension: Secondary | ICD-10-CM | POA: Insufficient documentation

## 2020-03-21 DIAGNOSIS — F17210 Nicotine dependence, cigarettes, uncomplicated: Secondary | ICD-10-CM | POA: Diagnosis not present

## 2020-03-21 DIAGNOSIS — F1194 Opioid use, unspecified with opioid-induced mood disorder: Secondary | ICD-10-CM | POA: Diagnosis not present

## 2020-03-21 DIAGNOSIS — F1220 Cannabis dependence, uncomplicated: Secondary | ICD-10-CM | POA: Diagnosis not present

## 2020-03-21 DIAGNOSIS — F332 Major depressive disorder, recurrent severe without psychotic features: Secondary | ICD-10-CM | POA: Diagnosis not present

## 2020-03-21 DIAGNOSIS — F329 Major depressive disorder, single episode, unspecified: Secondary | ICD-10-CM | POA: Diagnosis not present

## 2020-03-21 DIAGNOSIS — F1910 Other psychoactive substance abuse, uncomplicated: Secondary | ICD-10-CM

## 2020-03-21 DIAGNOSIS — F32A Depression, unspecified: Secondary | ICD-10-CM

## 2020-03-21 NOTE — ED Triage Notes (Signed)
Pt reports sore throat/hoarse x3 days. States "nervous breakdown" states she feels like she's a burden to her dad. Reports hx of suicidal ideation. States ongoing mental health issues, attempting to get her medical care moved to this area.

## 2020-03-21 NOTE — ED Notes (Signed)
Changed into burgendy scrubs with two staff members present

## 2020-03-22 ENCOUNTER — Encounter (HOSPITAL_BASED_OUTPATIENT_CLINIC_OR_DEPARTMENT_OTHER): Payer: Self-pay | Admitting: Registered Nurse

## 2020-03-22 DIAGNOSIS — F332 Major depressive disorder, recurrent severe without psychotic features: Secondary | ICD-10-CM

## 2020-03-22 LAB — SARS CORONAVIRUS 2 BY RT PCR (HOSPITAL ORDER, PERFORMED IN ~~LOC~~ HOSPITAL LAB): SARS Coronavirus 2: NEGATIVE

## 2020-03-22 LAB — BASIC METABOLIC PANEL
Anion gap: 12 (ref 5–15)
BUN: 12 mg/dL (ref 6–20)
CO2: 26 mmol/L (ref 22–32)
Calcium: 8.8 mg/dL — ABNORMAL LOW (ref 8.9–10.3)
Chloride: 101 mmol/L (ref 98–111)
Creatinine, Ser: 0.79 mg/dL (ref 0.44–1.00)
GFR, Estimated: >60 mL/min (ref >60)
Glucose, Bld: 165 mg/dL — ABNORMAL HIGH (ref 70–99)
Potassium: 4.2 mmol/L (ref 3.5–5.1)
Sodium: 139 mmol/L (ref 135–145)

## 2020-03-22 LAB — CBC WITH DIFFERENTIAL/PLATELET
Abs Immature Granulocytes: 0.68 10*3/uL — ABNORMAL HIGH (ref 0.00–0.07)
Basophils Absolute: 0.1 10*3/uL (ref 0.0–0.1)
Basophils Relative: 1 %
Eosinophils Absolute: 0 10*3/uL (ref 0.0–0.5)
Eosinophils Relative: 0 %
HCT: 39.3 % (ref 36.0–46.0)
Hemoglobin: 13.4 g/dL (ref 12.0–15.0)
Immature Granulocytes: 4 %
Lymphocytes Relative: 9 %
Lymphs Abs: 1.6 10*3/uL (ref 0.7–4.0)
MCH: 31.9 pg (ref 26.0–34.0)
MCHC: 34.1 g/dL (ref 30.0–36.0)
MCV: 93.6 fL (ref 80.0–100.0)
Monocytes Absolute: 1 10*3/uL (ref 0.1–1.0)
Monocytes Relative: 6 %
Neutro Abs: 13.8 10*3/uL — ABNORMAL HIGH (ref 1.7–7.7)
Neutrophils Relative %: 80 %
Platelets: 383 10*3/uL (ref 150–400)
RBC: 4.2 MIL/uL (ref 3.87–5.11)
RDW: 12.9 % (ref 11.5–15.5)
WBC: 17.3 10*3/uL — ABNORMAL HIGH (ref 4.0–10.5)
nRBC: 0 % (ref 0.0–0.2)

## 2020-03-22 LAB — RAPID URINE DRUG SCREEN, HOSP PERFORMED
Amphetamines: POSITIVE — AB
Barbiturates: NOT DETECTED
Benzodiazepines: POSITIVE — AB
Cocaine: NOT DETECTED
Opiates: NOT DETECTED
Tetrahydrocannabinol: POSITIVE — AB

## 2020-03-22 LAB — ETHANOL: Alcohol, Ethyl (B): <10 mg/dL (ref <10)

## 2020-03-22 LAB — GROUP A STREP BY PCR: Group A Strep by PCR: NOT DETECTED

## 2020-03-22 MED ORDER — LORAZEPAM 1 MG PO TABS
1.0000 mg | ORAL_TABLET | Freq: Once | ORAL | Status: AC
  Administered 2020-03-22: 1 mg via ORAL
  Filled 2020-03-22: qty 1

## 2020-03-22 NOTE — ED Notes (Signed)
Per LMFT Meiya, pt will be re-evaluated in the AM

## 2020-03-22 NOTE — Discharge Instructions (Addendum)
You were seen in the emergency department for sore throat and increased depression symptoms.  Your strep test and Covid test were negative.  This is likely viral.  Tylenol and ibuprofen for pain.  Warm salt water gargles.  Follow-up with your doctor.  You were evaluated by behavioral health and they felt you are appropriate for outpatient follow-up.  Please follow-up as directed.

## 2020-03-22 NOTE — ED Notes (Signed)
Pt medically cleared, vitals signs now q8h per protocol

## 2020-03-22 NOTE — ED Notes (Addendum)
Pt belongings secured and labeled in cabinet above respiratory; includes: pair of socks, bra, t-shirt, hoodie, coat, boots, hat; jewelry placed in specimen cup in bag with clothing, includes two chains, four charms, 2 pairs of earrings, chain from [employer]; one purse containing: book with pieces of paper as bookmark; cell phone and charger; cough drops;charger bank; air pods; sprite; perfume; hair brush; 13 cigarettes; hand cream; deodorant; gum packet x2 and gum container; 6 pens; a razor; lighter x2; vapex2; crackers; angel statue; sim card booklet; family mobile card; gel cream; nail polish; 5 hour energy; hair clip; m&ms; milky way bar; sharpie; key and chain; a wallet containg 2 condoms, medicaid card, various business cards, [employer] ending in [id number], [employer] gift card, Pharmacy discount card, MasterCard ending in [place], Paypal card ending in 28, [place] ID card, Visa ending in [id number] Pt medications placed in plastic baggie and labeled and put into Pyxis under "daily home meds", includes: proventil inhaler; lube gel drops;13 cyclobenzaprine 10mg ; 4 Seroquel 50mg ; 5 prednisone 10mg ; 11 prednisone 20mg ; 4 promethazine 25mg ; divalproex pill in the promethazine bottle; 2 1/2 buprenorphine 8mg ; another bottle of 8mg  buprenorphine with 2 pills; 28 adderall 20mg ; 35 divalproex 250mg 

## 2020-03-22 NOTE — ED Provider Notes (Signed)
[facility] EMERGENCY DEPARTMENT Provider Note   CSN: [id number] Arrival date & time: 03/21/20  2321     History Chief Complaint  Patient presents with  . Sore Throat  . Suicidal    Lindsay Miranda is a 30 y.o. female.  HPI     This is a 30 year old female with a history of anxiety, bipolar disorder, polysubstance abuse who presents with increasing thoughts of depression and despair.  Patient reports that she recently left her husband in [place].  She states that she has been clean for over 30 days.  She is living with her father.  She is attempting to bring her children to [place] "for a better life."  However, she is having difficulty doing this.  She states that she tested positive for multiple drugs and DCS was involved in [place].  She reports worsening symptoms of diminished self-worth and feels like she is a burden to her father.  She really would like to go back to [place] but her father feels she needs stabilization from a mental health perspective and brought her here.  She denies overt suicidality but reports increasing depressive symptoms and feelings of not wanting to live.  She has not been taking her medications as prescribed.  Patient does report sore throat over the last 3 days with diminished voice.  No fevers.  No known sick contacts or Covid exposures.  Past Medical History:  Diagnosis Date  . Anxiety   . Bipolar 1 disorder, depressed (HCC)   . Heroin abuse (HCC)   . Hypertension   . Renal disorder     Patient Active Problem List   Diagnosis Date Noted  . Polysubstance dependence including opioid type drug with complication, episodic abuse, with unspecified complication (HCC)   . MDD (major depressive disorder), recurrent severe, without psychosis (HCC)   . 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 KIDNEY SURGERY Right 2016     OB History    Gravida  1   Para       Term      Preterm      AB      Living        SAB      IAB      Ectopic      Multiple      Live Births              No family history on file.  Social History   Tobacco Use  . Smoking status: Current Every Day Smoker    Packs/day: 1.00    Types: Cigarettes  . Smokeless tobacco: Never Used  Substance Use Topics  . Alcohol use: Yes  . Drug use: Yes    Home Medications Prior to Admission medications   Medication Sig Start Date End Date Taking? Authorizing Provider  gabapentin (NEURONTIN) 600 MG tablet Take 600 mg by mouth 3 (three) times daily.    [provider]  ondansetron (ZOFRAN-ODT) 8 MG disintegrating tablet Take 8 mg by mouth every 8 (eight) hours as needed for nausea or vomiting.    [provider]  Oxycodone HCl 10 MG TABS Take 5-10 mg by mouth 3 (three) times daily. Pt would take 10 mg in the morning + night and 5 mg mid day.    [provider]  predniSONE (DELTASONE) 20 MG tablet 3 Tabs PO Days 1-3, then 2 tabs PO Days 4-6, then  1 tab PO Day 7-9, then Half Tab PO Day 10-12 03/08/20   Carroll Sage, PA-C  Prenatal MV-Min-FA-Omega-3 (PRENATAL GUMMIES/DHA & FA PO) Take 2 each by mouth daily.    [provider]  traZODone (DESYREL) 50 MG tablet Take 50-100 mg by mouth at bedtime.    [provider]    Allergies    Risperdal [risperidone] and Suboxone [buprenorphine hcl-naloxone hcl]  Review of Systems   Review of Systems  Constitutional: Negative for fever.  HENT: Positive for sore throat and voice change.   Respiratory: Negative for shortness of breath.   Gastrointestinal: Negative for abdominal pain and diarrhea.  Genitourinary: Negative for dysuria.  Psychiatric/Behavioral: Negative for self-injury. The patient is nervous/anxious.   All other systems reviewed and are negative.   Physical Exam Updated Vital Signs BP (!) 145/98   Pulse (!) 112   Temp 98.4 F (36.9 C) (Oral)   Resp 20   Ht  1.626 m (5\' 4" )   Wt 74.8 kg   LMP 03/08/2020   SpO2 96%   BMI 28.31 kg/m   Physical Exam Vitals and nursing note reviewed.  Constitutional:      Appearance: She is well-developed and well-nourished.  HENT:     Head: Normocephalic and atraumatic.     Mouth/Throat:     Mouth: Mucous membranes are moist.  Eyes:     Pupils: Pupils are equal, round, and reactive to light.  Cardiovascular:     Rate and Rhythm: Normal rate and regular rhythm.     Heart sounds: Normal heart sounds.  Pulmonary:     Effort: Pulmonary effort is normal. No respiratory distress.     Breath sounds: No wheezing.  Abdominal:     General: Bowel sounds are normal.     Palpations: Abdomen is soft.  Musculoskeletal:     Cervical back: Neck supple.  Skin:    General: Skin is warm and dry.  Neurological:     Mental Status: She is alert and oriented to person, place, and time.  Psychiatric:        Mood and Affect: Mood and affect normal.     Comments: Tearful but nontoxic in no acute distress     ED Results / Procedures / Treatments   Labs (all labs ordered are listed, but only abnormal results are displayed) Labs Reviewed  CBC WITH DIFFERENTIAL/PLATELET - Abnormal; Notable for the following components:      Result Value   WBC 17.3 (*)    Neutro Abs 13.8 (*)    Abs Immature Granulocytes 0.68 (*)    All other components within normal limits  BASIC METABOLIC PANEL - Abnormal; Notable for the following components:   Glucose, Bld 165 (*)    Calcium 8.8 (*)    All other components within normal limits  RAPID URINE DRUG SCREEN, HOSP PERFORMED - Abnormal; Notable for the following components:   Benzodiazepines POSITIVE (*)    Amphetamines POSITIVE (*)    Tetrahydrocannabinol POSITIVE (*)    All other components within normal limits  GROUP A STREP BY PCR  SARS CORONAVIRUS 2 BY RT PCR (HOSPITAL ORDER, PERFORMED IN [place])  ETHANOL    EKG None  Radiology No results  found.  Procedures Procedures   Medications Ordered in ED Medications  LORazepam (ATIVAN) tablet 1 mg (1 mg Oral Given 03/22/20 03/24/20)    ED Course  I have reviewed the triage vital signs and the nursing notes.  Pertinent labs &  imaging results that were available during my care of the patient were reviewed by me and considered in my medical decision making (see chart for details).    MDM Rules/Calculators/A&P                          Patient presents with increasing depressive symptoms.  Feelings of worthlessness.  She has significant social stressors and history of polysubstance abuse.  Currently living with her father.  She reports passive SI without a plan.  Reports that she has been "clean."  Otherwise her only physical complaint is sore throat.  Labs obtained for medical clearance.  She has multiple positives on her UDS.  EtOH negative.  Only significant lab is a white count of 17.  Unclear clinical significance.  She is afebrile.  Strep screening and Covid screening are negative.  Suspect sore throat and laryngitis likely related to viral etiology.  Patient is medically cleared for TTS evaluation.  Of note, initial pulse rate documented at 144.  She was very tearful and anxious.  She was given a dose of Ativan.  Repeat pulse check 112.   Final Clinical Impression(s) / ED Diagnoses Final diagnoses:  Laryngitis  Depression, unspecified depression type  Polysubstance abuse [facility])    Rx / DC Orders ED Discharge Orders    None       Calen Geister, Mayer Masker, MD 03/22/20 [phone]

## 2020-03-22 NOTE — ED Notes (Signed)
Pt given cough drop from belongings per EDP
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
## 2020-03-22 NOTE — ED Notes (Signed)
TTS in progress 

## 2020-03-22 NOTE — ED Notes (Signed)
Per patient, she is staying with her father @ [address], [place], [place]

## 2020-03-22 NOTE — ED Provider Notes (Signed)
Emergency Medicine Observation Re-evaluation Note  Lindsay Miranda is a 30 y.o. female, seen on rounds today.  Pt initially presented to the ED for complaints of Sore Throat and Suicidal Currently, the patient is in no distress.  Physical Exam  BP 109/79 (BP Location: Right Arm)   Pulse 70   Temp 97.7 F (36.5 C) (Oral)   Resp 14   Ht 5\' 4"  (1.626 m)   Wt 74.8 kg   LMP 03/08/2020   SpO2 99%   BMI 28.31 kg/m  Physical Exam General: No acute distress Cardiac: Regular rate and rhythm Lungs: Clear to auscultation Psych: Cooperative  ED Course / MDM  EKG:    I have reviewed the labs performed to date as well as medications administered while in observation.  Recent changes in the last 24 hours include TTS evaluation.  They feel the patient is psychiatrically cleared.  They are recommending outpatient follow-up.  Plan  Current plan is for discharge. Patient is not under full IVC at this time.   03/10/2020, MD 03/22/20 1800

## 2020-03-22 NOTE — ED Notes (Signed)
EDP at bedside  

## 2020-03-22 NOTE — ED Notes (Signed)
Pt. and belongings wanded by security 

## 2020-03-22 NOTE — Consult Note (Signed)
[facility] Psych ED Discharge  03/22/2020 10:42 AM Lindsay Miranda  MRN:  [id number] Principal Problem: MDD (major depressive disorder), recurrent severe, without psychosis (HCC) Discharge Diagnoses: Principal Problem:   MDD (major depressive disorder), recurrent severe, without psychosis (HCC) Active Problems:   Polysubstance dependence including opioid type drug with complication, episodic abuse, with unspecified complication (HCC)   Opioid-induced mood disorder [facility])  Patient location:  [facility] Provider location:  [facility]  Subjective: "I just don't know what I need to do to get services.  Feel like I'm a burden on my family." Lindsay Miranda, 30 y.o., female patient seen via tele health by this provider, consulted with Dr. Lucianne Muss; and chart reviewed on 03/22/20.  On evaluation Lindsay Miranda reports she moved to [place] from [place] after completing a detox/rehab program.  States she moved in with her father for support but is having a difficult time finding outpatient psychiatric services and does not know how to go about it.  Patient also reports that she has not been able to really see her kids and wants to get her life together for children.  Patient's UDS was positive for amphetamines, benzodiazepines, and marijuana.  Patient reports she is prescribed Adderall and Klonopin from her provider in [place] which she has been seeing virtually for refills.  Patient states she does not want to kill her self she just wants to get set up with services for therapy and medication management. During evaluation Lindsay Miranda is sitting on side of bed in no acute distress.  She is alert, oriented x 4, calm and cooperative.  Her mood is anxious/depressed with congruent affect.  She does not appear to be responding to internal/external stimuli or delusional thoughts.  Patient denies suicidal/self-harm/homicidal ideation, psychosis, and paranoia.  Patient answered question  appropriately.   Total Time spent with patient: 30 minutes  Past Psychiatric History: See above  Past Medical History:  Past Medical History:  Diagnosis Date  . Anxiety   . Bipolar 1 disorder, depressed (HCC)   . Heroin abuse (HCC)   . Hypertension   . Renal disorder     Past Surgical History:  Procedure Laterality Date  . KIDNEY SURGERY Right 2016   Family History: History reviewed. No pertinent family history. Family Psychiatric  History: None reported Social History:  Social History   Substance and Sexual Activity  Alcohol Use Yes     Social History   Substance and Sexual Activity  Drug Use Yes    Social History   Socioeconomic History  . Marital status: Legally Separated    Spouse name: Not on file  . Number of children: Not on file  . Years of education: Not on file  . Highest education level: Not on file  Occupational History  . Not on file  Tobacco Use  . Smoking status: Current Every Day Smoker    Packs/day: 1.00    Types: Cigarettes  . Smokeless tobacco: Never Used  Substance and Sexual Activity  . Alcohol use: Yes  . Drug use: Yes  . Sexual activity: Not on file  Other Topics Concern  . Not on file  Social History Narrative  . Not on file   Social Determinants of Health   Financial Resource Strain: Not on file  Food Insecurity: Not on file  Transportation Needs: Not on file  Physical Activity: Not on file  Stress: Not on file  Social Connections: Not on file    Has this patient used any form of  tobacco in the last 30 days? (Cigarettes, Smokeless Tobacco, Cigars, and/or Pipes) A prescription for an FDA-approved tobacco cessation medication was offered at discharge and the patient refused  Current Medications: No current facility-administered medications for this encounter.   Current Outpatient Medications  Medication Sig Dispense Refill  . gabapentin (NEURONTIN) 600 MG tablet Take 600 mg by mouth 3 (three) times daily.    .  ondansetron (ZOFRAN-ODT) 8 MG disintegrating tablet Take 8 mg by mouth every 8 (eight) hours as needed for nausea or vomiting.    . Oxycodone HCl 10 MG TABS Take 5-10 mg by mouth 3 (three) times daily. Pt would take 10 mg in the morning + night and 5 mg mid day.    . predniSONE (DELTASONE) 20 MG tablet 3 Tabs PO Days 1-3, then 2 tabs PO Days 4-6, then 1 tab PO Day 7-9, then Half Tab PO Day 10-12 20 tablet 0  . Prenatal MV-Min-FA-Omega-3 (PRENATAL GUMMIES/DHA & FA PO) Take 2 each by mouth daily.    . traZODone (DESYREL) 50 MG tablet Take 50-100 mg by mouth at bedtime.     PTA Medications: (Not in a hospital admission)   Musculoskeletal: Strength & Muscle Tone: within normal limits Gait & Station: normal Patient leans: N/A  Psychiatric Specialty Exam: Physical Exam Vitals and nursing note reviewed. Exam conducted with a chaperone present.  Constitutional:      General: She is not in acute distress.    Appearance: Normal appearance. She is not ill-appearing.  HENT:     Head: Normocephalic.  Eyes:     Pupils: Pupils are equal, round, and reactive to light.  Cardiovascular:     Rate and Rhythm: Normal rate.  Pulmonary:     Effort: Pulmonary effort is normal.  Musculoskeletal:        General: Normal range of motion.     Cervical back: Normal range of motion.  Skin:    General: Skin is warm and dry.  Neurological:     Mental Status: She is alert and oriented to person, place, and time.  Psychiatric:        Attention and Perception: Attention and perception normal. She does not perceive auditory or visual hallucinations.        Mood and Affect: Affect normal. Mood is anxious and depressed.        Speech: Speech normal.        Behavior: Behavior normal. Behavior is cooperative.        Thought Content: Thought content normal. Thought content is not paranoid or delusional. Thought content does not include homicidal or suicidal ideation.        Cognition and Memory: Cognition and memory  normal.        Judgment: Judgment is impulsive.     Review of Systems  Constitutional: Negative.   HENT: Negative.   Eyes: Negative.   Respiratory: Negative.   Cardiovascular: Negative.   Gastrointestinal: Negative.   Genitourinary: Negative.   Musculoskeletal: Negative.   Skin: Negative.   Neurological: Negative.   Hematological: Negative.   Psychiatric/Behavioral: Negative for agitation, behavioral problems, confusion, hallucinations, self-injury, sleep disturbance and suicidal ideas. Nervous/anxious: Stable.     Blood pressure 109/79, pulse 70, temperature 97.7 F (36.5 C), temperature source Oral, resp. rate 14, height 5\' 4"  (1.626 m), weight 74.8 kg, last menstrual period 03/08/2020, SpO2 99 %, unknown if currently breastfeeding.Body mass index is 28.31 kg/m.  General Appearance: Casual  Eye Contact:  Good  Speech:  Clear and  Coherent and Normal Rate  Volume:  Normal  Mood:  Anxious and Depressed  Affect:  Congruent  Thought Process:  Coherent, Goal Directed and Descriptions of Associations: Intact  Orientation:  Full (Time, Place, and Person)  Thought Content:  WDL  Suicidal Thoughts:  No  Homicidal Thoughts:  No  Memory:  Immediate;   Good Recent;   Good  Judgement:  Intact  Insight:  Present  Psychomotor Activity:  Normal  Concentration:  Concentration: Good and Attention Span: Good  Recall:  Good  Fund of Knowledge:  Good  Language:  Good  Akathisia:  No  Handed:  Right  AIMS (if indicated):     Assets:  Communication Skills Desire for Improvement Housing Social Support  ADL's:  Intact  Cognition:  WNL  Sleep:        Demographic Factors:  Caucasian and Unemployed  Loss Factors: Loss of visitation rights which could not related to substance use disorder  Historical Factors: NA  Risk Reduction Factors:   Sense of responsibility to family, Religious beliefs about death, Living with another person, especially a relative and Positive social  support  Continued Clinical Symptoms:  Alcohol/Substance Abuse/Dependencies Previous Psychiatric Diagnoses and Treatments  Cognitive Features That Contribute To Risk:  None    Suicide Risk:  Minimal: No identifiable suicidal ideation.  Patients presenting with no risk factors but with morbid ruminations; may be classified as minimal risk based on the severity of the depressive symptoms   Follow-up Information    Services, [facility]. Go on 03/26/2020.   Why: Please report to the address listed above on Friday, February 4th at 10AM. Please note that furture appointments will not be scheduled until you Medicaid has been transferred to the [place].  Contact information: [address] [place] [address] [phone]               Plan Of Care/Follow-up recommendations:  Activity:  As tolerated Diet:  Heart healthy  Disposition:  Psychiatrically cleared No evidence of imminent risk to self or others at present.   Patient does not meet criteria for psychiatric inpatient admission. Supportive therapy provided about ongoing stressors. Refer to IOP. Discussed crisis plan, support from social network, calling 911, coming to the Emergency Department, and calling Suicide Hotline.  Abbigale Mcelhaney, NP 03/22/2020, 10:42 AM

## 2020-03-22 NOTE — ED Notes (Signed)
TTS being done in room 

## 2020-03-26 ENCOUNTER — Other Ambulatory Visit: Payer: Self-pay

## 2020-03-26 ENCOUNTER — Observation Stay (HOSPITAL_COMMUNITY)
Admission: EM | Admit: 2020-03-26 | Discharge: 2020-03-28 | Disposition: A | Discharge status: Home / Self Care | Payer: Medicaid - Out of State | Attending: Emergency Medicine | Admitting: Emergency Medicine

## 2020-03-26 ENCOUNTER — Encounter (HOSPITAL_COMMUNITY): Payer: Self-pay

## 2020-03-26 ENCOUNTER — Emergency Department (HOSPITAL_COMMUNITY): Payer: Medicaid - Out of State

## 2020-03-26 DIAGNOSIS — F19230 Other psychoactive substance dependence with withdrawal, uncomplicated: Secondary | ICD-10-CM | POA: Insufficient documentation

## 2020-03-26 DIAGNOSIS — Z20822 Contact with and (suspected) exposure to covid-19: Secondary | ICD-10-CM | POA: Diagnosis not present

## 2020-03-26 DIAGNOSIS — R000 Tachycardia, unspecified: Secondary | ICD-10-CM | POA: Diagnosis present

## 2020-03-26 DIAGNOSIS — F13230 Sedative, hypnotic or anxiolytic dependence with withdrawal, uncomplicated: Secondary | ICD-10-CM

## 2020-03-26 DIAGNOSIS — F332 Major depressive disorder, recurrent severe without psychotic features: Secondary | ICD-10-CM | POA: Insufficient documentation

## 2020-03-26 DIAGNOSIS — F1124 Opioid dependence with opioid-induced mood disorder: Secondary | ICD-10-CM | POA: Diagnosis not present

## 2020-03-26 DIAGNOSIS — F1123 Opioid dependence with withdrawal: Secondary | ICD-10-CM | POA: Diagnosis not present

## 2020-03-26 DIAGNOSIS — F13239 Sedative, hypnotic or anxiolytic dependence with withdrawal, unspecified: Secondary | ICD-10-CM | POA: Diagnosis present

## 2020-03-26 DIAGNOSIS — F13930 Sedative, hypnotic or anxiolytic use, unspecified with withdrawal, uncomplicated: Secondary | ICD-10-CM

## 2020-03-26 DIAGNOSIS — Z79899 Other long term (current) drug therapy: Secondary | ICD-10-CM | POA: Insufficient documentation

## 2020-03-26 DIAGNOSIS — F13939 Sedative, hypnotic or anxiolytic use, unspecified with withdrawal, unspecified: Secondary | ICD-10-CM | POA: Diagnosis present

## 2020-03-26 DIAGNOSIS — F419 Anxiety disorder, unspecified: Secondary | ICD-10-CM | POA: Diagnosis not present

## 2020-03-26 DIAGNOSIS — F1193 Opioid use, unspecified with withdrawal: Secondary | ICD-10-CM

## 2020-03-26 LAB — COMPREHENSIVE METABOLIC PANEL
ALT: 33 U/L (ref 0–44)
AST: 20 U/L (ref 15–41)
Albumin: 3.5 g/dL (ref 3.5–5.0)
Alkaline Phosphatase: 54 U/L (ref 38–126)
Anion gap: 9 (ref 5–15)
BUN: 11 mg/dL (ref 6–20)
CO2: 21 mmol/L — ABNORMAL LOW (ref 22–32)
Calcium: 9.1 mg/dL (ref 8.9–10.3)
Chloride: 103 mmol/L (ref 98–111)
Creatinine, Ser: 0.66 mg/dL (ref 0.44–1.00)
GFR, Estimated: >60 mL/min (ref >60)
Glucose, Bld: 118 mg/dL — ABNORMAL HIGH (ref 70–99)
Potassium: 3.8 mmol/L (ref 3.5–5.1)
Sodium: 133 mmol/L — ABNORMAL LOW (ref 135–145)
Total Bilirubin: 0.8 mg/dL (ref 0.3–1.2)
Total Protein: 7.2 g/dL (ref 6.5–8.1)

## 2020-03-26 LAB — CBC WITH DIFFERENTIAL/PLATELET
Basophils Absolute: 0.1 10*3/uL (ref 0.0–0.1)
Basophils Relative: 1 %
Eosinophils Absolute: 0 10*3/uL (ref 0.0–0.5)
Eosinophils Relative: 0 %
HCT: 47.4 % — ABNORMAL HIGH (ref 36.0–46.0)
Hemoglobin: 16.3 g/dL — ABNORMAL HIGH (ref 12.0–15.0)
Lymphocytes Relative: 14 %
Lymphs Abs: 2 10*3/uL (ref 0.7–4.0)
MCH: 32.3 pg (ref 26.0–34.0)
MCHC: 34.4 g/dL (ref 30.0–36.0)
MCV: 94 fL (ref 80.0–100.0)
Metamyelocytes Relative: 3 %
Monocytes Absolute: 1 10*3/uL (ref 0.1–1.0)
Monocytes Relative: 7 %
Neutro Abs: 10.8 10*3/uL — ABNORMAL HIGH (ref 1.7–7.7)
Neutrophils Relative %: 75 %
Platelets: 346 10*3/uL (ref 150–400)
RBC: 5.04 MIL/uL (ref 3.87–5.11)
RDW: 12.8 % (ref 11.5–15.5)
WBC: 14.4 10*3/uL — ABNORMAL HIGH (ref 4.0–10.5)
nRBC: 0 % (ref 0.0–0.2)

## 2020-03-26 LAB — RESP PANEL BY RT-PCR (FLU A&B, COVID) ARPGX2
Influenza A by PCR: NEGATIVE
Influenza B by PCR: NEGATIVE
SARS Coronavirus 2 by RT PCR: NEGATIVE

## 2020-03-26 LAB — SALICYLATE LEVEL: Salicylate Lvl: <7 mg/dL — ABNORMAL LOW (ref 7.0–30.0)

## 2020-03-26 LAB — POC URINE PREG, ED: Preg Test, Ur: NEGATIVE

## 2020-03-26 LAB — RAPID URINE DRUG SCREEN, HOSP PERFORMED
Amphetamines: NOT DETECTED
Barbiturates: NOT DETECTED
Benzodiazepines: NOT DETECTED
Cocaine: NOT DETECTED
Opiates: NOT DETECTED
Tetrahydrocannabinol: POSITIVE — AB

## 2020-03-26 LAB — BLOOD GAS, VENOUS
Acid-base deficit: 0.7 mmol/L (ref 0.0–2.0)
Bicarbonate: 24.4 mmol/L (ref 20.0–28.0)
FIO2: 21
O2 Saturation: 97.9 %
Patient temperature: 36.7
pCO2, Ven: 32.9 mmHg — ABNORMAL LOW (ref 44.0–60.0)
pH, Ven: 7.453 — ABNORMAL HIGH (ref 7.250–7.430)
pO2, Ven: 99.2 mmHg — ABNORMAL HIGH (ref 32.0–45.0)

## 2020-03-26 LAB — D-DIMER, QUANTITATIVE: D-Dimer, Quant: <0.27 ug/mL-FEU (ref 0.00–0.50)

## 2020-03-26 LAB — TROPONIN I (HIGH SENSITIVITY)
Troponin I (High Sensitivity): <2 ng/L (ref <18)
Troponin I (High Sensitivity): <2 ng/L (ref <18)

## 2020-03-26 LAB — ETHANOL: Alcohol, Ethyl (B): <10 mg/dL (ref <10)

## 2020-03-26 LAB — ACETAMINOPHEN LEVEL: Acetaminophen (Tylenol), Serum: <10 ug/mL — ABNORMAL LOW (ref 10–30)

## 2020-03-26 LAB — MAGNESIUM: Magnesium: 1.6 mg/dL — ABNORMAL LOW (ref 1.7–2.4)

## 2020-03-26 LAB — TSH: TSH: 0.989 u[IU]/mL (ref 0.350–4.500)

## 2020-03-26 MED ORDER — SODIUM CHLORIDE 0.9 % IV BOLUS
500.0000 mL | Freq: Once | INTRAVENOUS | Status: AC
  Administered 2020-03-26: 500 mL via INTRAVENOUS

## 2020-03-26 MED ORDER — ONDANSETRON HCL 4 MG/2ML IJ SOLN
4.0000 mg | Freq: Four times a day (QID) | INTRAMUSCULAR | Status: DC | PRN
  Administered 2020-03-26 – 2020-03-27 (×2): 4 mg via INTRAVENOUS
  Filled 2020-03-26 (×3): qty 2

## 2020-03-26 MED ORDER — ALBUTEROL SULFATE (2.5 MG/3ML) 0.083% IN NEBU
2.5000 mg | INHALATION_SOLUTION | RESPIRATORY_TRACT | Status: DC | PRN
  Administered 2020-03-27: 2.5 mg via RESPIRATORY_TRACT
  Filled 2020-03-26: qty 3

## 2020-03-26 MED ORDER — LORAZEPAM 1 MG PO TABS: 0.0000 mg | ORAL_TABLET | Freq: Two times a day (BID) | ORAL | Status: DC

## 2020-03-26 MED ORDER — LORAZEPAM 2 MG/ML IJ SOLN
1.0000 mg | INTRAMUSCULAR | Status: DC | PRN
  Administered 2020-03-27: 2 mg via INTRAVENOUS
  Filled 2020-03-26 (×2): qty 1

## 2020-03-26 MED ORDER — LORAZEPAM 2 MG/ML IJ SOLN: 0.0000 mg | Freq: Two times a day (BID) | INTRAMUSCULAR | Status: DC

## 2020-03-26 MED ORDER — SODIUM CHLORIDE 0.9 % IV BOLUS
500.0000 mL | Freq: Once | INTRAVENOUS | Status: AC
  Administered 2020-03-27: 500 mL via INTRAVENOUS

## 2020-03-26 MED ORDER — LORAZEPAM 2 MG/ML IJ SOLN
1.0000 mg | Freq: Once | INTRAMUSCULAR | Status: AC
  Administered 2020-03-26: 1 mg via INTRAVENOUS
  Filled 2020-03-26: qty 1

## 2020-03-26 MED ORDER — LORAZEPAM 1 MG PO TABS: 1.0000 mg | ORAL_TABLET | ORAL | Status: DC | PRN

## 2020-03-26 MED ORDER — SODIUM CHLORIDE 0.9 % IV BOLUS
1000.0000 mL | Freq: Once | INTRAVENOUS | Status: AC
  Administered 2020-03-26: 1000 mL via INTRAVENOUS

## 2020-03-26 MED ORDER — FOLIC ACID 1 MG PO TABS
1.0000 mg | ORAL_TABLET | Freq: Every day | ORAL | Status: DC
  Administered 2020-03-26 – 2020-03-28 (×3): 1 mg via ORAL
  Filled 2020-03-26 (×3): qty 1

## 2020-03-26 MED ORDER — LORAZEPAM 2 MG/ML IJ SOLN: 0.0000 mg | Freq: Four times a day (QID) | INTRAMUSCULAR | Status: DC

## 2020-03-26 MED ORDER — GABAPENTIN 300 MG PO CAPS
600.0000 mg | ORAL_CAPSULE | Freq: Three times a day (TID) | ORAL | Status: DC
  Administered 2020-03-26 – 2020-03-27 (×2): 600 mg via ORAL
  Filled 2020-03-26 (×3): qty 2

## 2020-03-26 MED ORDER — LORAZEPAM 1 MG PO TABS
0.0000 mg | ORAL_TABLET | Freq: Four times a day (QID) | ORAL | Status: DC
  Administered 2020-03-26 (×2): 2 mg via ORAL
  Filled 2020-03-26 (×2): qty 2

## 2020-03-26 MED ORDER — THIAMINE HCL 100 MG PO TABS
100.0000 mg | ORAL_TABLET | Freq: Every day | ORAL | Status: DC
  Administered 2020-03-26 – 2020-03-28 (×3): 100 mg via ORAL
  Filled 2020-03-26 (×3): qty 1

## 2020-03-26 MED ORDER — OXYCODONE HCL 5 MG PO TABS
5.0000 mg | ORAL_TABLET | Freq: Four times a day (QID) | ORAL | Status: DC | PRN
  Administered 2020-03-27 – 2020-03-28 (×5): 5 mg via ORAL
  Filled 2020-03-26 (×6): qty 1

## 2020-03-26 MED ORDER — ONDANSETRON HCL 4 MG/2ML IJ SOLN
4.0000 mg | Freq: Once | INTRAMUSCULAR | Status: AC
  Administered 2020-03-26: 4 mg via INTRAVENOUS
  Filled 2020-03-26: qty 2

## 2020-03-26 MED ORDER — POLYETHYLENE GLYCOL 3350 17 G PO PACK: 17.0000 g | PACK | Freq: Every day | ORAL | Status: DC | PRN

## 2020-03-26 MED ORDER — LORAZEPAM 2 MG/ML IJ SOLN
0.0000 mg | Freq: Four times a day (QID) | INTRAMUSCULAR | Status: DC
  Administered 2020-03-27: 2 mg via INTRAVENOUS
  Administered 2020-03-28: 1 mg via INTRAVENOUS
  Administered 2020-03-28: 2 mg via INTRAVENOUS
  Filled 2020-03-26 (×3): qty 1

## 2020-03-26 MED ORDER — ACETAMINOPHEN 325 MG PO TABS: 650.0000 mg | ORAL_TABLET | Freq: Four times a day (QID) | ORAL | Status: DC | PRN

## 2020-03-26 MED ORDER — HEPARIN SODIUM (PORCINE) 5000 UNIT/ML IJ SOLN
5000.0000 [IU] | Freq: Three times a day (TID) | INTRAMUSCULAR | Status: DC
  Administered 2020-03-27: 5000 [IU] via SUBCUTANEOUS
  Filled 2020-03-26 (×4): qty 1

## 2020-03-26 MED ORDER — THIAMINE HCL 100 MG/ML IJ SOLN
100.0000 mg | Freq: Every day | INTRAMUSCULAR | Status: DC
  Filled 2020-03-26: qty 2

## 2020-03-26 MED ORDER — GABAPENTIN 600 MG PO TABS
600.0000 mg | ORAL_TABLET | Freq: Three times a day (TID) | ORAL | Status: DC
  Filled 2020-03-26 (×8): qty 1

## 2020-03-26 MED ORDER — ADULT MULTIVITAMIN W/MINERALS CH
1.0000 | ORAL_TABLET | Freq: Every day | ORAL | Status: DC
  Administered 2020-03-26 – 2020-03-28 (×3): 1 via ORAL
  Filled 2020-03-26 (×3): qty 1

## 2020-03-26 MED ORDER — ACETAMINOPHEN 650 MG RE SUPP: 650.0000 mg | Freq: Four times a day (QID) | RECTAL | Status: DC | PRN

## 2020-03-26 MED ORDER — ONDANSETRON HCL 4 MG PO TABS: 4.0000 mg | ORAL_TABLET | Freq: Four times a day (QID) | ORAL | Status: DC | PRN

## 2020-03-26 MED ORDER — METHYLPREDNISOLONE SODIUM SUCC 125 MG IJ SOLR
125.0000 mg | Freq: Once | INTRAMUSCULAR | Status: AC
  Administered 2020-03-26: 125 mg via INTRAVENOUS
  Filled 2020-03-26: qty 2

## 2020-03-26 NOTE — ED Notes (Signed)

## 2020-03-26 NOTE — ED Notes (Signed)
Pt called for staff, requesting provider speak with patient and father about plan of care. PA made aware at this time.

## 2020-03-26 NOTE — Clinical Social Work Note (Signed)
TOC consulted due to substance use. CSW spoke with pt in room. Pt states that she plans to return to [facility] however she is waiting to hear when they have a bed for her. Pt states her father is also looking at a place in [place] for her to possibly go.   Pt states that she is not sure how she is going to get her medications as she has out of state Medicaid and it is too expensive at the pharmacy. She states they do not accept good Rx. CSW informed her she would not qualify for [facility] voucher. CSW informed pt it would be best for her to call the pharmacy about what she can do as we have no other options to assistance with medications.

## 2020-03-26 NOTE — ED Notes (Signed)
Pt educated on the need for a urine sample, pt verbalized understanding and will notify staff when able to provide a sample. This RN verbalized understanding and in agreement at this time.

## 2020-03-26 NOTE — ED Provider Notes (Signed)
[facility] EMERGENCY DEPARTMENT Provider Note   CSN: [id number] Arrival date & time: 03/26/20  1103     History Chief Complaint  Patient presents with  . Withdrawal    Lindsay Miranda is a 30 y.o. female history of bipolar, hypertension, polysubstance abuse.  Patient sent in today by [facility] rehabilitation center for medical clearance.  Patient presented there for detox from clonazepam as well as Subutex, she last took around 24 hours prior to arrival.  She reports that when she went to [facility] today they told her she was too tachycardic to stay and send her to the ER.  Patient reports that she has been feeling symptoms of withdrawal since this morning she reports racing heart sensation, body aches, fatigue that feels similar to prior withdrawal.  She does report a history of withdrawal seizures but does not feel that is impending at this time.  Denies fever/chills, cough, hemoptysis, chest pain, abdominal pain, dysuria/hematuria, numbness/tingling, weakness, suicidal ideations, homicidal ideations, visual/auditory hallucinations, tactile hallucinations or any additional concerns.  HPI     Past Medical History:  Diagnosis Date  . Anxiety   . Bipolar 1 disorder, depressed (HCC)   . Heroin abuse (HCC)   . Hypertension   . Renal disorder     Patient Active Problem List   Diagnosis Date Noted  . Polysubstance dependence including opioid type drug with complication, episodic abuse, with unspecified complication (HCC)   . MDD (major depressive disorder), recurrent severe, without psychosis (HCC)   . 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 KIDNEY SURGERY Right 2016     OB History    Gravida  1   Para      Term      Preterm      AB      Living        SAB      IAB      Ectopic      Multiple      Live Births              History reviewed. No pertinent family history.  Social History   Tobacco Use   . Smoking status: Current Every Day Smoker    Packs/day: 1.00    Types: Cigarettes  . Smokeless tobacco: Never Used  Substance Use Topics  . Alcohol use: Yes  . Drug use: Yes    Home Medications Prior to Admission medications   Medication Sig Start Date End Date Taking? Authorizing Provider  gabapentin (NEURONTIN) 600 MG tablet Take 600 mg by mouth 3 (three) times daily.    [provider]  ondansetron (ZOFRAN-ODT) 8 MG disintegrating tablet Take 8 mg by mouth every 8 (eight) hours as needed for nausea or vomiting.    [provider]  Oxycodone HCl 10 MG TABS Take 5-10 mg by mouth 3 (three) times daily. Pt would take 10 mg in the morning + night and 5 mg mid day.    [provider]  predniSONE (DELTASONE) 20 MG tablet 3 Tabs PO Days 1-3, then 2 tabs PO Days 4-6, then 1 tab PO Day 7-9, then Half Tab PO Day 10-12 03/08/20   Carroll Sage, PA-C  Prenatal MV-Min-FA-Omega-3 (PRENATAL GUMMIES/DHA & FA PO) Take 2 each by mouth daily.    [provider]  traZODone (DESYREL) 50 MG tablet Take 50-100 mg by mouth at bedtime.    [provider]    Allergies  Risperdal [risperidone] and Suboxone [buprenorphine hcl-naloxone hcl]  Review of Systems   Review of Systems Ten systems are reviewed and are negative for acute change except as noted in the HPI  Physical Exam Updated Vital Signs BP (!) 151/100 (BP Location: Right Arm)   Pulse (!) 137   Temp 98.6 F (37 C) (Oral)   Resp 18   Ht 5\' 5"  (1.651 m)   Wt 72.6 kg   LMP 03/08/2020   SpO2 100%   BMI 26.63 kg/m   Physical Exam Constitutional:      General: She is not in acute distress.    Appearance: Normal appearance. She is well-developed. She is not ill-appearing or diaphoretic.  HENT:     Head: Normocephalic and atraumatic.  Eyes:     General: Vision grossly intact. Gaze aligned appropriately.     Pupils: Pupils are equal, round, and reactive to light.  Neck:     Trachea:  Trachea and phonation normal.  Cardiovascular:     Rate and Rhythm: Regular rhythm. Tachycardia present.  Pulmonary:     Effort: Pulmonary effort is normal. No respiratory distress.  Abdominal:     General: There is no distension.     Palpations: Abdomen is soft.     Tenderness: There is no abdominal tenderness. There is no guarding or rebound.  Musculoskeletal:        General: Normal range of motion.     Cervical back: Normal range of motion.  Skin:    General: Skin is warm and dry.  Neurological:     Mental Status: She is alert.     GCS: GCS eye subscore is 4. GCS verbal subscore is 5. GCS motor subscore is 6.     Comments: Speech is clear and goal oriented, follows commands Major Cranial nerves without deficit, no facial droop Moves extremities without ataxia, coordination intact  Psychiatric:        Attention and Perception: She does not perceive auditory or visual hallucinations.        Mood and Affect: Mood is anxious. Affect is tearful.        Speech: Speech normal.        Behavior: Behavior normal. Behavior is cooperative.        Thought Content: Thought content does not include homicidal or suicidal ideation.     ED Results / Procedures / Treatments   Labs (all labs ordered are listed, but only abnormal results are displayed) Labs Reviewed - No data to display  EKG EKG Interpretation  Date/Time:  Friday March 26 2020 11:37:13 EST Ventricular Rate:  122 PR Interval:    QRS Duration: 84 QT Interval:  299 QTC Calculation: 426 R Axis:   94 Text Interpretation: Sinus tachycardia LAE, consider biatrial enlargement Anterior infarct, old No STEMI Confirmed by [id number] [phone]) on 03/26/2020 11:47:12 AM   Radiology No results found.  Procedures Procedures   Medications Ordered in ED Medications - No data to display  ED Course  I have reviewed the triage vital signs and the nursing notes.  Pertinent labs & imaging results that were available during my  care of the patient were reviewed by me and considered in my medical decision making (see chart for details).    MDM Rules/Calculators/A&P                         Additional history obtained from: 1. Nursing notes from this visit. 2. EMR review, patient had  ER visit 03/21/2020, diagnosis of laryngitis, depression, polysubstance.  She had presented with complaints of suicidal ideations and sore throat.  It appears patient had been medically cleared, TTS was consulted and then they subsequently psych cleared the patient.  It appears they referred the patient to [facility] recovery services for today. ---------------------------- 30 year old female arrived from [facility] for medical clearance.  She is tachycardic appears to be in benzodiazepine withdrawal.  Last use around 24 hours ago.  She denies any recent illness, she reports she feels generally poor.  Will obtain medical clearance labs, EKG, chest x-ray.  Will start patient on CIWA protocol for benzodiazepine withdrawal and give IV fluids - I ordered, reviewed and interpreted labs which include: CBC which shows mixed ptosis of 14.4 and elevated hemoglobin of 16.3 feel these abnormalities are likely secondary to hemoconcentration and withdrawal, low suspicion for infection. Tylenol and salicylate levels are negative.  No evidence for toxic ingestion of the substance Ethanol is negative.  Presentation is possibly due to alcohol withdrawal but given patient's history suspect instead benzodiazepine withdrawal High-sensitivity troponin negative.  Doubt ACS. Covid/influenza panel negative. Pregnancy test negative. UDS shows THC. CMP shows no emergent electrolyte derangement, AKI, LFT elevations or gap.  CXR:  IMPRESSION:  No acute cardiopulmonary abnormality.   EKG:  Sinus tachycardia LAE, consider biatrial enlargement Anterior infarct, old No STEMI Confirmed by Alvester Chou [phone]) on 03/26/2020 11:47:12 AM ---------------- Patient was given 2  doses of 2 mg p.o.  She remained tachycardic rate 120 bpm.  I discussed with patient admission to hospitalist service for benzodiazepine withdrawal she was initially agreeable.  She then had discussion with her family member, her father who is at bedside.  They have reached out to local primary care provider office as well as local rehab facilities.  They do not want admission to the hospital today they have Klonopin at home that they plan to start patient on her regular dose as was previously prescribed by her PCP in January.  Their plan is then to see the primary care doctor on Monday for a long-term taper and rehab facility placement.  Patient does not have SI HI hallucinations she is calm and cooperative on reassessment there is no indication for IVC of this patient.  Additional etiologies tachycardia were considered, given persistent tachycardia despite multiple doses of Ativan will obtain D-dimer for PE. Discussed with Dr. Audley Hose who agrees.  On reassessment patient is agreeable to plan, patient is without chest pain or shortness of breath, will give additional fluids as well as IV Ativan.  Pending negative D-dimer and improvement anticipate discharge with family.  Care handoff given to [facility] at shift change.  Note: Portions of this report may have been transcribed using voice recognition software. Every effort was made to ensure accuracy; however, inadvertent computerized transcription errors may still be present. Final Clinical Impression(s) / ED Diagnoses Final diagnoses:  None    Rx / DC Orders ED Discharge Orders    None       Elizabeth Palau 03/26/20 1855    Cheryll Cockayne, MD 03/28/20 1940

## 2020-03-26 NOTE — ED Notes (Signed)
X Ray at bedside at this time.  

## 2020-03-26 NOTE — ED Triage Notes (Signed)
Pt to er, pt states that she is trying to detox from her klonopin, states that she last had it a day ago, states [facility] and they [facility] here to get medically cleared before they would let her in.

## 2020-03-26 NOTE — ED Notes (Signed)
PA at bedside at this time.  

## 2020-03-26 NOTE — ED Notes (Addendum)
Dr. Camillo Flaming made aware of patient's most recent blood pressure. Currently awaiting a page back at this time.   9:44 PM New orders obtained for most recent blood pressure.

## 2020-03-26 NOTE — Discharge Instructions (Signed)
At this time there does not appear to be the presence of an emergent medical condition, however there is always the potential for conditions to change. Please read and follow the below instructions.  Please return to the Emergency Department immediately for any new or worsening symptoms. Please be sure to follow up with your Primary Care Provider Monday as you have scheduled.  Go to the nearest Emergency Department immediately if: You have fever or chills Have a seizure. Become very confused. Lose consciousness. Have difficulty breathing. Have serious thoughts about hurting yourself or someone else. You have any new/concerning or worsening of symptoms.   Please read the additional information packets attached to your discharge summary.  Do not take your medicine if  develop an itchy rash, swelling in your mouth or lips, or difficulty breathing; call 911 and seek immediate emergency medical attention if this occurs.  You may review your lab tests and imaging results in their entirety on your MyChart account.  Please discuss all results of fully with your primary care provider and other specialist at your follow-up visit.  Note: Portions of this text may have been transcribed using voice recognition software. Every effort was made to ensure accuracy; however, inadvertent computerized transcription errors may still be present.

## 2020-03-26 NOTE — H&P (Signed)
TRH H&P    Patient Demographics:    Lindsay Miranda, is a 30 y.o. female  MRN: [id number]  DOB - 21-May-1990  Admit Date - 03/26/2020  Referring MD/NP/PA: Uvaldo Rising  Outpatient Primary MD for the patient is Patient, No Pcp Per  Patient coming from: Home  Chief complaint- withdrawal   HPI:    Lindsay Miranda  is a 30 y.o. female, with hx of substance abuse, HTN, Bipolar 1 disorder and anxiety presents to the ER with a  Chief complaint of withdrawal. Patient reports that she came here from [place] to detox. She went to [facility] today to check herself in to detox - and they told her she was too tachy and she needed to come to the ED. Patient reports that she was taking Klonopin 2mg  TID for 3.5 years. Her last dose was 24 hours ago. She reports that she has felt withdrawal symptoms all day per her report. She describes her symptoms as "fuzzy headed," palpitations, shortness of breath, diarrhea, and "tightness in her arms, hands, and legs" that "feel like a seizure coming on." Patient reports that in the past she has had seizures from stress. She is not on seizure medication because she was not able to tolerate valproic acid. Patient reports that she has had nausea and vomiting as well. Her emesis had dark brown streaks in it per her report. Last normal meal is during the medical exam. Last normal BM was 2 days ago. She had diarrhea today.   Patient also reports she has chest pain. She has "anxiety" chest pain daily that is 5/10. Today her chest pain is 10/10. It is in the center of her chest and feels like pressure and stabbing. She reports that the pain is non exertional.   Patient smokes 1 ppd. She drinks a glass of wine every 2-3 days. She last used marijuana Jan 16. She is prescribed adderall as well. Her last heroin use was 3.5 years ago. Patient reports that she has not used cocaine.    In the ED T 98.1, HR  128, R 18-20, BP 107/85 100% Leukocytosis 14.4, hgb 16.3 Chem panel is unremarkable D-Dimer < 0.27 Tylenol < 10 Asa < 7.0 Neg pregnancy test Covid (-) Ativan 1mg , zofran 4 mg 1.5L NaCl  Thiamine EKG = HR 122, QTc 426 Sinus tach    Review of systems:    In addition to the HPI above,  No Fever-chills, No Headache, No changes with Vision or hearing, No problems swallowing food or Liquids, Admits to chest pain, dyspnea, but no cough No Abdominal pain, Admits to Nausea and Vomiting,as well as diarrhea No Blood in stool or Urine, No dysuria, No new skin rashes or bruises, No new joints pains-aches,  No new weakness, tingling, numbness in any extremity, No recent weight gain or loss, No polyuria, polydypsia or polyphagia, No significant Mental Stressors.  All other systems reviewed and are negative.    Past History of the following :    Past Medical History:  Diagnosis Date  . Anxiety   .  Bipolar 1 disorder, depressed (HCC)   . Heroin abuse (HCC)   . Hypertension   . Renal disorder       Past Surgical History:  Procedure Laterality Date  . KIDNEY SURGERY Right 2016      Social History:      Social History   Tobacco Use  . Smoking status: Current Every Day Smoker    Packs/day: 1.00    Types: Cigarettes  . Smokeless tobacco: Never Used  Substance Use Topics  . Alcohol use: Yes       Family History :    History reviewed. No pertinent family history. Mother died with colon cancer at the age of 60 Grand father had lung cancer   Home Medications:   Prior to Admission medications   Medication Sig Start Date End Date Taking? Authorizing Provider  gabapentin (NEURONTIN) 600 MG tablet Take 600 mg by mouth 3 (three) times daily.    [provider]  ondansetron (ZOFRAN-ODT) 8 MG disintegrating tablet Take 8 mg by mouth every 8 (eight) hours as needed for nausea or vomiting.    [provider]  Oxycodone HCl 10 MG TABS Take 5-10 mg by  mouth 3 (three) times daily. Pt would take 10 mg in the morning + night and 5 mg mid day.    [provider]  predniSONE (DELTASONE) 20 MG tablet 3 Tabs PO Days 1-3, then 2 tabs PO Days 4-6, then 1 tab PO Day 7-9, then Half Tab PO Day 10-12 03/08/20   Carroll Sage, PA-C  Prenatal MV-Min-FA-Omega-3 (PRENATAL GUMMIES/DHA & FA PO) Take 2 each by mouth daily.    [provider]  traZODone (DESYREL) 50 MG tablet Take 50-100 mg by mouth at bedtime.    [provider]     Allergies:     Allergies  Allergen Reactions  . Risperdal [Risperidone] Other (See Comments)    Jaw locks.   . Suboxone [Buprenorphine Hcl-Naloxone Hcl] Nausea And Vomiting    Violent throwing up.      Physical Exam:   Vitals  Blood pressure 107/85, pulse (!) 128, temperature 98.1 F (36.7 C), temperature source Oral, resp. rate 20, height 5\' 5"  (1.651 m), weight 72.6 kg, last menstrual period 03/08/2020, SpO2 100 %, unknown if currently breastfeeding.  1.  General: Patient laying supine in bed eating [employer]  2. Psychiatric: Anxious, behavior is normal, cooperative with exam  3. Neurologic: CN II-XII intact, speech and language normal, no deficits on limited exam  4. HEENMT:  Head is atraumatic normocephalic, pupils reactive, mucous membranes moist, trachea normal  5. Respiratory : Wheezing on exam, diminished breath sounds in the lower lung fields, no crackles or rhonchi  6. Cardiovascular : HR tachy, rhythm regular, no murmurs rubs or gallops, no peripheral edema  7. Gastrointestinal:  Abdomen is soft, non distended, non tender to palpation, bowel sounds normal  8. Skin:  No acute lesions on limited skin exam  9.Musculoskeletal:  No acute deformity, no calf tenderness, no peripheral edema    Data Review:    CBC Recent Labs  Lab 03/22/20 0047 03/26/20 1153  WBC 17.3* 14.4*  HGB 13.4 16.3*  HCT 39.3 47.4*  PLT 383 346  MCV 93.6 94.0  MCH 31.9 32.3  MCHC  34.1 34.4  RDW 12.9 12.8  LYMPHSABS 1.6 2.0  MONOABS 1.0 1.0  EOSABS 0.0 0.0  BASOSABS 0.1 0.1   ------------------------------------------------------------------------------------------------------------------  Results for orders placed or performed during the hospital encounter of 03/26/20 (  from the past 48 hour(s))  Urine rapid drug screen (hosp performed)     Status: Abnormal   Collection Time: 03/26/20 11:30 AM  Result Value Ref Range   Opiates NONE DETECTED NONE DETECTED   Cocaine NONE DETECTED NONE DETECTED   Benzodiazepines NONE DETECTED NONE DETECTED   Amphetamines NONE DETECTED NONE DETECTED   Tetrahydrocannabinol POSITIVE (A) NONE DETECTED   Barbiturates NONE DETECTED NONE DETECTED    Comment: (NOTE) DRUG SCREEN FOR MEDICAL PURPOSES ONLY.  IF CONFIRMATION IS NEEDED FOR ANY PURPOSE, NOTIFY LAB WITHIN 5 DAYS.  LOWEST DETECTABLE LIMITS FOR URINE DRUG SCREEN Drug Class                     Cutoff (ng/mL) Amphetamine and metabolites    1000 Barbiturate and metabolites    200 Benzodiazepine                 200 Tricyclics and metabolites     300 Opiates and metabolites        300 Cocaine and metabolites        300 THC                            50 Performed at [facility], [address] [place], [place] [address]   Comprehensive metabolic panel     Status: Abnormal   Collection Time: 03/26/20 11:53 AM  Result Value Ref Range   Sodium 133 (L) 135 - 145 mmol/L   Potassium 3.8 3.5 - 5.1 mmol/L   Chloride 103 98 - 111 mmol/L   CO2 21 (L) 22 - 32 mmol/L   Glucose, Bld 118 (H) 70 - 99 mg/dL    Comment: Glucose reference range applies only to samples taken after fasting for at least 8 hours.   BUN 11 6 - 20 mg/dL   Creatinine, Ser 9.140.66 0.44 - 1.00 mg/dL   Calcium 9.1 8.9 - 78.210.3 mg/dL   Total Protein 7.2 6.5 - 8.1 g/dL   Albumin 3.5 3.5 - 5.0 g/dL   AST 20 15 - 41 U/L   ALT 33 0 - 44 U/L   Alkaline Phosphatase 54 38 - 126 U/L   Total Bilirubin 0.8 0.3 - 1.2 mg/dL    GFR, Estimated >95>60 >62>60 mL/min    Comment: (NOTE) Calculated using the CKD-EPI Creatinine Equation (2021)    Anion gap 9 5 - 15    Comment: Performed at [facility], [address] [place], [place] [address]  Ethanol     Status: None   Collection Time: 03/26/20 11:53 AM  Result Value Ref Range   Alcohol, Ethyl (B) <10 <10 mg/dL    Comment: (NOTE) Lowest detectable limit for serum alcohol is 10 mg/dL.  For medical purposes only. Performed at [facility], [address] [place], [place] [address]   CBC with Diff     Status: Abnormal   Collection Time: 03/26/20 11:53 AM  Result Value Ref Range   WBC 14.4 (H) 4.0 - 10.5 K/uL    Comment: WHITE COUNT CONFIRMED ON SMEAR   RBC 5.04 3.87 - 5.11 MIL/uL   Hemoglobin 16.3 (H) 12.0 - 15.0 g/dL   HCT 96.247.4 (H) 95.236.0 - 84.146.0 %   MCV 94.0 80.0 - 100.0 fL   MCH 32.3 26.0 - 34.0 pg   MCHC 34.4 30.0 - 36.0 g/dL   RDW 32.412.8 40.111.5 - 02.715.5 %   Platelets 346 150 - 400  K/uL   nRBC 0.0 0.0 - 0.2 %   Neutrophils Relative % 75 %   Neutro Abs 10.8 (H) 1.7 - 7.7 K/uL   Lymphocytes Relative 14 %   Lymphs Abs 2.0 0.7 - 4.0 K/uL   Monocytes Relative 7 %   Monocytes Absolute 1.0 0.1 - 1.0 K/uL   Eosinophils Relative 0 %   Eosinophils Absolute 0.0 0.0 - 0.5 K/uL   Basophils Relative 1 %   Basophils Absolute 0.1 0.0 - 0.1 K/uL   WBC Morphology MILD LEFT SHIFT (1-5% METAS, OCC MYELO, OCC BANDS)     Comment: TOXIC GRANULATION VACUOLATED NEUTROPHILS    Metamyelocytes Relative 3 %   Reactive, Benign Lymphocytes PRESENT     Comment: Performed at [facility], [address] [place], [place] [address]  Salicylate level     Status: Abnormal   Collection Time: 03/26/20 11:53 AM  Result Value Ref Range   Salicylate Lvl <7.0 (L) 7.0 - 30.0 mg/dL    Comment: Performed at [facility], [address] [place], [place] [address]  Acetaminophen level     Status: Abnormal   Collection Time: 03/26/20 11:53 AM  Result Value Ref Range   Acetaminophen (Tylenol), Serum  <10 (L) 10 - 30 ug/mL    Comment: (NOTE) Therapeutic concentrations vary significantly. A range of 10-30 ug/mL  may be an effective concentration for many patients. However, some  are best treated at concentrations outside of this range. Acetaminophen concentrations >150 ug/mL at 4 hours after ingestion  and >50 ug/mL at 12 hours after ingestion are often associated with  toxic reactions.  Performed at [facility], [address] [place], [place] [address]   Troponin I (High Sensitivity)     Status: None   Collection Time: 03/26/20 11:53 AM  Result Value Ref Range   Troponin I (High Sensitivity) <2 <18 ng/L    Comment: (NOTE) Elevated high sensitivity troponin I (hsTnI) values and significant  changes across serial measurements may suggest ACS but many other  chronic and acute conditions are known to elevate hsTnI results.  Refer to the "Links" section for chest pain algorithms and additional  guidance. Performed at [facility], [address] [place], [place] [address]   Resp Panel by RT-PCR (Flu A&B, Covid) Nasopharyngeal Swab     Status: None   Collection Time: 03/26/20 12:10 PM   Specimen: Nasopharyngeal Swab; Nasopharyngeal(NP) swabs in vial transport medium  Result Value Ref Range   SARS Coronavirus 2 by RT PCR NEGATIVE NEGATIVE    Comment: (NOTE) SARS-CoV-2 target nucleic acids are NOT DETECTED.  The SARS-CoV-2 RNA is generally detectable in upper respiratory specimens during the acute phase of infection. The lowest concentration of SARS-CoV-2 viral copies this assay can detect is 138 copies/mL. A negative result does not preclude SARS-Cov-2 infection and should not be used as the sole basis for treatment or other patient management decisions. A negative result may occur with  improper specimen collection/handling, submission of specimen other than nasopharyngeal swab, presence of viral mutation(s) within the areas targeted by this assay, and inadequate number of  viral copies(<138 copies/mL). A negative result must be combined with clinical observations, patient history, and epidemiological information. The expected result is Negative.  Fact Sheet for Patients:  BloggerCourse.com  Fact Sheet for Healthcare Providers:  SeriousBroker.it  This test is no t yet approved or cleared by the [place] FDA and  has been authorized for detection and/or diagnosis of SARS-CoV-2 by FDA under an Emergency  Use Authorization (EUA). This EUA will remain  in effect (meaning this test can be used) for the duration of the COVID-19 declaration under Section 564(b)(1) of the Act, 21 U.S.C.section 360bbb-3(b)(1), unless the authorization is terminated  or revoked sooner.       Influenza A by PCR NEGATIVE NEGATIVE   Influenza B by PCR NEGATIVE NEGATIVE    Comment: (NOTE) The Xpert Xpress SARS-CoV-2/FLU/RSV plus assay is intended as an aid in the diagnosis of influenza from Nasopharyngeal swab specimens and should not be used as a sole basis for treatment. Nasal washings and aspirates are unacceptable for Xpert Xpress SARS-CoV-2/FLU/RSV testing.  Fact Sheet for Patients: BloggerCourse.com  Fact Sheet for Healthcare Providers: SeriousBroker.it  This test is not yet approved or cleared by the [place] FDA and has been authorized for detection and/or diagnosis of SARS-CoV-2 by FDA under an Emergency Use Authorization (EUA). This EUA will remain in effect (meaning this test can be used) for the duration of the COVID-19 declaration under Section 564(b)(1) of the Act, 21 U.S.C. section 360bbb-3(b)(1), unless the authorization is terminated or revoked.  Performed at [facility], [address], [place], [place] [address]   POC urine preg, ED     Status: Normal   Collection Time: 03/26/20 12:30 PM  Result Value Ref Range   Preg Test, Ur Negative Negative   D-dimer, quantitative (not at [facility])     Status: None   Collection Time: 03/26/20  6:35 PM  Result Value Ref Range   D-Dimer, Quant <0.27 0.00 - 0.50 ug/mL-FEU    Comment: (NOTE) At the manufacturer cut-off value of 0.5 g/mL FEU, this assay has a negative predictive value of 95-100%.This assay is intended for use in conjunction with a clinical pretest probability (PTP) assessment model to exclude pulmonary embolism (PE) and deep venous thrombosis (DVT) in outpatients suspected of PE or DVT. Results should be correlated with clinical presentation. Performed at [facility], [address], [place], [place] [address]     Chemistries  Recent Labs  Lab 03/22/20 [phone] 03/26/20 1153  NA 139 133*  K 4.2 3.8  CL 101 103  CO2 26 21*  GLUCOSE 165* 118*  BUN 12 11  CREATININE 0.79 0.66  CALCIUM 8.8* 9.1  AST  --  20  ALT  --  33  ALKPHOS  --  54  BILITOT  --  0.8   ------------------------------------------------------------------------------------------------------------------  ------------------------------------------------------------------------------------------------------------------ GFR: Estimated Creatinine Clearance: 100.7 mL/min (by C-G formula based on SCr of 0.66 mg/dL). Liver Function Tests: Recent Labs  Lab 03/26/20 1153  AST 20  ALT 33  ALKPHOS 54  BILITOT 0.8  PROT 7.2  ALBUMIN 3.5   No results for input(s): LIPASE, AMYLASE in the last 168 hours. No results for input(s): AMMONIA in the last 168 hours. Coagulation Profile: No results for input(s): INR, PROTIME in the last 168 hours. Cardiac Enzymes: No results for input(s): CKTOTAL, CKMB, CKMBINDEX, TROPONINI in the last 168 hours. BNP (last 3 results) No results for input(s): PROBNP in the last 8760 hours. HbA1C: No results for input(s): HGBA1C in the last 72 hours. CBG: No results for input(s): GLUCAP in the last 168 hours. Lipid Profile: No results for input(s): CHOL, HDL, LDLCALC, TRIG, CHOLHDL,  LDLDIRECT in the last 72 hours. Thyroid Function Tests: No results for input(s): TSH, T4TOTAL, FREET4, T3FREE, THYROIDAB in the last 72 hours. Anemia Panel: No results for input(s): VITAMINB12, FOLATE, FERRITIN, TIBC, IRON, RETICCTPCT in the last 72 hours.  --------------------------------------------------------------------------------------------------------------- Urine analysis:  Component Value Date/Time   COLORURINE AMBER (A) 12/09/2016 0940   APPEARANCEUR HAZY (A) 12/09/2016 0940   LABSPEC 1.018 12/09/2016 0940   PHURINE 7.0 12/09/2016 0940   GLUCOSEU NEGATIVE 12/09/2016 0940   HGBUR NEGATIVE 12/09/2016 0940   BILIRUBINUR NEGATIVE 12/09/2016 0940   KETONESUR 20 (A) 12/09/2016 0940   PROTEINUR 100 (A) 12/09/2016 0940   NITRITE NEGATIVE 12/09/2016 0940   LEUKOCYTESUR NEGATIVE 12/09/2016 0940      Imaging Results:    DG Chest Portable 1 View  Result Date: 03/26/2020 CLINICAL DATA:  Drug withdrawal EXAM: PORTABLE CHEST 1 VIEW COMPARISON:  None. FINDINGS: The cardiomediastinal silhouette is normal in contour. No pleural effusion. No pneumothorax. No acute pleuroparenchymal abnormality. Visualized abdomen is unremarkable. No acute osseous abnormality noted. Linear structures overlying the upper shoulders likely external to patient. IMPRESSION: No acute cardiopulmonary abnormality. Electronically Signed   By: Meda Klinefelter MD   On: 03/26/2020 11:58    My personal review of EKG: Rhythm ST, Rate 122 /min, QTc 426 ,no Acute ST changes   Assessment & Plan:    Active Problems:   Withdrawal from benzodiazepine (HCC)   1. Withdrawal 1. Stopped benzo and opiate 2 days ago 2. Palpitations at home 3. CIWA protocol 4. UDS pending 5. Monitor on tele 2. Leukocytosis 1. Likely acute phase reactant 2. No infectious symptoms 3. Covid negative  4. CXR = No acute cardiopulm abnormality 5. UA pending 6. Trend with CBC in the am 3. Tachycardia 1. EKG = Sinus tach 2. 2/2  withdrawal - treat as above 3. Remained tachy after 1.5L bolus 4. Monitor on tele 4.    DVT Prophylaxis-   heparin- SCDs   AM Labs Ordered, also please review Full Orders  Family Communication: No family at bedside  Code Status:  Full  Admission status: Observation  Time spent in minutes : 64   Zaela Graley B Zierle-Ghosh DO

## 2020-03-26 NOTE — ED Notes (Signed)
Pt provided with a warm blanket per request, repositioned for most comfort. Room light dimmed, door closed at this time. Pt appears to be comfortable at this time, educated to call for staff for any needs and pt in agreement at this time. Cardiac monitor remains in place and vital signs cycling q30 minutes at this time.

## 2020-03-26 NOTE — ED Notes (Signed)
Lab at bedside at this time.  

## 2020-03-26 NOTE — ED Provider Notes (Signed)
Patient was received at shift change from [facility], PA-C, please see his note for full detail.  In short patient with significant medical history of anxiety, bipolar, hypertension, polysubstance abuse presents due to detox from clonazepam.  She went to [facility] today for help with her detox but they were unable to help her because she was too tachycardic.  She was then sent here for further evaluation.  Patient denies suicidal or homicidal ideations, she endorses occasional chest palpitations, but denies chest pain  or shortness of breath, has no other complaints at this time.  Current work-up shows CBC with slight leukocytosis of 14.4, decreased from yesterday of 17.4.  No signs anemia.  CMP shows slight hyponatremia 133, slight decrease in CO2 of 21, slight hyperglycemia of 118, no anion gap present.  Initial troponin is less than 2, acetaminophen less than 10, salicylate level less than 7.  Ethanol less than 10.  Rapid drug screen shows cannabinoids.  D-dimer 0.27.  EKG is sinus tach without signs of ischemia no ST elevation depression noted.  Chest x-ray does not reveal any acute findings.  Per previous provider left heart rate improved patient could be discharged home with close follow-up with primary care provider for clonazepam detox.       Physical Exam  BP 107/85 (BP Location: Right Arm)   Pulse (!) 128   Temp 98.1 F (36.7 C) (Oral)   Resp 20   Ht 5\' 5"  (1.651 m)   Wt 72.6 kg   LMP 03/08/2020   SpO2 100%   BMI 26.63 kg/m   Physical Exam Vitals and nursing note reviewed.  Constitutional:      General: She is not in acute distress.    Appearance: Normal appearance. She is not ill-appearing or diaphoretic.  HENT:     Head: Normocephalic and atraumatic.     Nose: No congestion or rhinorrhea.  Eyes:     General: No scleral icterus.       Right eye: No discharge.        Left eye: No discharge.     Conjunctiva/sclera: Conjunctivae normal.  Neck:     Comments:  Patient's neck was palpated, there is no large thyroid, no palpable nodules noted. Cardiovascular:     Rate and Rhythm: Regular rhythm. Tachycardia present.     Pulses: Normal pulses.     Heart sounds: No murmur heard. No friction rub. No gallop.   Pulmonary:     Effort: Pulmonary effort is normal. No respiratory distress.     Breath sounds: Wheezing present.     Comments: Patient has noted episodic wheezing heard in the lower lobes bilaterally, no Rales, stridor, rhonchi present Musculoskeletal:     Cervical back: Normal range of motion and neck supple. No tenderness.     Comments: Patient is moving all 4 extremities at difficulty.  Skin:    General: Skin is warm and dry.  Neurological:     Mental Status: She is alert.     Comments: Patient is having no difficulty with word finding.  Psychiatric:        Mood and Affect: Mood normal.     ED Course/Procedures     Procedures  MDM  Patient continues to have tachycardia in the upper 120s to lower 130s, is endorsing heart palpitations.  Despite providing her with fluids and Ativan.    Spoke with Dr. 03/10/2020 of the hospitalist team, she has accepted the patient and will come down evaluate her.   I  suspect patient suffering from possible withdraw and will need further observation and evaluation.  Patient is agreeable to this.  Patient care be transferred to admitting team.             Carroll Sage, PA-C 03/26/20 2028    Cheryll Cockayne, MD 03/26/20 2105

## 2020-03-27 DIAGNOSIS — F1123 Opioid dependence with withdrawal: Secondary | ICD-10-CM | POA: Diagnosis not present

## 2020-03-27 DIAGNOSIS — F13230 Sedative, hypnotic or anxiolytic dependence with withdrawal, uncomplicated: Secondary | ICD-10-CM

## 2020-03-27 LAB — CBC
HCT: 44.4 % (ref 36.0–46.0)
Hemoglobin: 14.9 g/dL (ref 12.0–15.0)
MCH: 31.6 pg (ref 26.0–34.0)
MCHC: 33.6 g/dL (ref 30.0–36.0)
MCV: 94.3 fL (ref 80.0–100.0)
Platelets: 324 10*3/uL (ref 150–400)
RBC: 4.71 MIL/uL (ref 3.87–5.11)
RDW: 12.6 % (ref 11.5–15.5)
WBC: 9.9 10*3/uL (ref 4.0–10.5)
nRBC: 0 % (ref 0.0–0.2)

## 2020-03-27 LAB — URINALYSIS, ROUTINE W REFLEX MICROSCOPIC
Bilirubin Urine: NEGATIVE
Glucose, UA: NEGATIVE mg/dL
Hgb urine dipstick: NEGATIVE
Ketones, ur: NEGATIVE mg/dL
Leukocytes,Ua: NEGATIVE
Nitrite: NEGATIVE
Protein, ur: NEGATIVE mg/dL
Specific Gravity, Urine: 1.014 (ref 1.005–1.030)
pH: 9 — ABNORMAL HIGH (ref 5.0–8.0)

## 2020-03-27 LAB — COMPREHENSIVE METABOLIC PANEL
ALT: 27 U/L (ref 0–44)
AST: 13 U/L — ABNORMAL LOW (ref 15–41)
Albumin: 3.4 g/dL — ABNORMAL LOW (ref 3.5–5.0)
Alkaline Phosphatase: 49 U/L (ref 38–126)
Anion gap: 7 (ref 5–15)
BUN: 13 mg/dL (ref 6–20)
CO2: 24 mmol/L (ref 22–32)
Calcium: 8.9 mg/dL (ref 8.9–10.3)
Chloride: 105 mmol/L (ref 98–111)
Creatinine, Ser: 0.57 mg/dL (ref 0.44–1.00)
GFR, Estimated: >60 mL/min (ref >60)
Glucose, Bld: 129 mg/dL — ABNORMAL HIGH (ref 70–99)
Potassium: 4.7 mmol/L (ref 3.5–5.1)
Sodium: 136 mmol/L (ref 135–145)
Total Bilirubin: 0.7 mg/dL (ref 0.3–1.2)
Total Protein: 6.8 g/dL (ref 6.5–8.1)

## 2020-03-27 LAB — HIV ANTIBODY (ROUTINE TESTING W REFLEX): HIV Screen 4th Generation wRfx: NONREACTIVE

## 2020-03-27 LAB — TROPONIN I (HIGH SENSITIVITY): Troponin I (High Sensitivity): <2 ng/L (ref <18)

## 2020-03-27 MED ORDER — CLONIDINE HCL 0.1 MG PO TABS: 0.1000 mg | ORAL_TABLET | ORAL | Status: DC

## 2020-03-27 MED ORDER — GABAPENTIN 400 MG PO CAPS
800.0000 mg | ORAL_CAPSULE | Freq: Four times a day (QID) | ORAL | Status: DC
  Administered 2020-03-27 – 2020-03-28 (×3): 800 mg via ORAL
  Filled 2020-03-27 (×3): qty 2

## 2020-03-27 MED ORDER — LORAZEPAM 2 MG/ML IJ SOLN
2.0000 mg | Freq: Once | INTRAMUSCULAR | Status: AC
  Administered 2020-03-27: 2 mg via INTRAVENOUS
  Filled 2020-03-27: qty 1

## 2020-03-27 MED ORDER — LOPERAMIDE HCL 2 MG PO CAPS
2.0000 mg | ORAL_CAPSULE | ORAL | Status: DC | PRN
Start: 2020-03-27 — End: 2020-03-28

## 2020-03-27 MED ORDER — TRAZODONE HCL 50 MG PO TABS
50.0000 mg | ORAL_TABLET | Freq: Every day | ORAL | Status: DC
  Administered 2020-03-27: 50 mg via ORAL
  Filled 2020-03-27: qty 1

## 2020-03-27 MED ORDER — ONDANSETRON 4 MG PO TBDP: 4.0000 mg | ORAL_TABLET | Freq: Four times a day (QID) | ORAL | Status: DC | PRN

## 2020-03-27 MED ORDER — METHOCARBAMOL 500 MG PO TABS: 500.0000 mg | ORAL_TABLET | Freq: Three times a day (TID) | ORAL | Status: DC | PRN

## 2020-03-27 MED ORDER — SERTRALINE HCL 50 MG PO TABS: 50.0000 mg | ORAL_TABLET | Freq: Every morning | ORAL | Status: DC

## 2020-03-27 MED ORDER — BENZTROPINE MESYLATE 1 MG PO TABS
0.5000 mg | ORAL_TABLET | Freq: Two times a day (BID) | ORAL | Status: DC
  Administered 2020-03-27 – 2020-03-28 (×3): 0.5 mg via ORAL
  Filled 2020-03-27 (×3): qty 1

## 2020-03-27 MED ORDER — HALOPERIDOL 5 MG PO TABS
10.0000 mg | ORAL_TABLET | Freq: Two times a day (BID) | ORAL | Status: DC
  Administered 2020-03-27 – 2020-03-28 (×3): 10 mg via ORAL
  Filled 2020-03-27 (×3): qty 2

## 2020-03-27 MED ORDER — SERTRALINE HCL 50 MG PO TABS
150.0000 mg | ORAL_TABLET | Freq: Every day | ORAL | Status: DC
  Administered 2020-03-27 – 2020-03-28 (×2): 150 mg via ORAL
  Filled 2020-03-27 (×2): qty 3

## 2020-03-27 MED ORDER — CLONIDINE HCL 0.1 MG PO TABS
0.1000 mg | ORAL_TABLET | Freq: Four times a day (QID) | ORAL | Status: DC
  Administered 2020-03-27 – 2020-03-28 (×2): 0.1 mg via ORAL
  Filled 2020-03-27 (×2): qty 1

## 2020-03-27 MED ORDER — SERTRALINE HCL 50 MG PO TABS: 150.0000 mg | ORAL_TABLET | Freq: Every morning | ORAL | Status: DC

## 2020-03-27 MED ORDER — CLONIDINE HCL 0.1 MG PO TABS: 0.1000 mg | ORAL_TABLET | Freq: Every day | ORAL | Status: DC

## 2020-03-27 MED ORDER — DICYCLOMINE HCL 10 MG PO CAPS: 20.0000 mg | ORAL_CAPSULE | Freq: Four times a day (QID) | ORAL | Status: DC | PRN

## 2020-03-27 MED ORDER — QUETIAPINE FUMARATE 25 MG PO TABS
50.0000 mg | ORAL_TABLET | Freq: Every day | ORAL | Status: DC
  Administered 2020-03-27: 50 mg via ORAL
  Filled 2020-03-27: qty 2

## 2020-03-27 MED ORDER — CLONAZEPAM 0.5 MG PO TABS
1.0000 mg | ORAL_TABLET | Freq: Three times a day (TID) | ORAL | Status: DC
  Administered 2020-03-27 – 2020-03-28 (×3): 1 mg via ORAL
  Filled 2020-03-27 (×3): qty 2

## 2020-03-27 MED ORDER — NAPROXEN 250 MG PO TABS: 500.0000 mg | ORAL_TABLET | Freq: Two times a day (BID) | ORAL | Status: DC | PRN

## 2020-03-27 MED ORDER — HYDROXYZINE HCL 25 MG PO TABS: 25.0000 mg | ORAL_TABLET | Freq: Four times a day (QID) | ORAL | Status: DC | PRN

## 2020-03-27 NOTE — Progress Notes (Signed)
PROGRESS NOTE    Lindsay Miranda  NFA:[id number] DOB: [birth year] DOA: 03/26/2020 PCP: Patient, No Pcp Per    Brief Narrative:  30 year old female with a history of substance abuse, bipolar disorder, anxiety, who is chronically on benzodiazepines, Subutex, amongst other medications.  She recently moved to the area from [place] to detox of medications.  When she had checked herself into detox, she was noted to be tachycardic with heart rate in the 120s 130s.  She was sent to the ER for evaluation where she was subsequently admitted for likely benzodiazepine withdrawal.   Assessment & Plan:   Active Problems:   Withdrawal from benzodiazepine (HCC)   Sinus tachycardia -Heart rate remains in the 130 range today -Work-up including TSH, D-dimer, troponin negative -Suspect this is related to withdrawal  Benzodiazepine withdrawal -Patient was chronically taking Klonopin which was abruptly discontinued recently -We will start on home dose of Klonopin until she can follow-up with her mental health providers  Withdrawal from Subutex -Patient does not wish to restart this or any opiates -We will start on clonidine withdrawal protocol   DVT prophylaxis: heparin injection 5,000 Units Start: 03/26/20 2200 SCDs Start: 03/26/20 2043  Code Status: Full code Family Communication: Discussed with father at the bedside Disposition Plan: Status is: Observation  The patient remains OBS appropriate and will d/c before 2 midnights.  Dispo: The patient is from: Home              Anticipated d/c is to: Home              Anticipated d/c date is: 1 day              Patient currently is not medically stable to d/c.   Difficult to place patient No    Consultants:     Procedures:     Antimicrobials:       Subjective: Patient feels anxious.  She reports that she can feel her heart rapidly beating.  She complains of diffuse body pains, but attributes this to withdrawal from  Subutex  Objective: Vitals:   03/27/20 0328 03/27/20 1044 03/27/20 1438 03/27/20 2059  BP: 107/69  (!) 142/87 118/86  Pulse: (!) 107  (!) 133 (!) 120  Resp: 18   18  Temp: (!) 97 F (36.1 C)  98.2 F (36.8 C) 98.4 F (36.9 C)  TempSrc:   Oral Oral  SpO2: 95% 96% 99% 98%  Weight:      Height:        Intake/Output Summary (Last 24 hours) at 03/27/2020 2133 Last data filed at 03/27/2020 1300 Gross per 24 hour  Intake 740 ml  Output -  Net 740 ml   Filed Weights   03/26/20 1119 03/26/20 2217  Weight: 72.6 kg 80 kg    Examination:  General exam: Appears calm and comfortable  Respiratory system: Clear to auscultation. Respiratory effort normal. Cardiovascular system: S1 & S2 heard, tachycardic. No JVD, murmurs, rubs, gallops or clicks. No pedal edema. Gastrointestinal system: Abdomen is nondistended, soft and nontender. No organomegaly or masses felt. Normal bowel sounds heard. Central nervous system: Alert and oriented. No focal neurological deficits. Extremities: Symmetric 5 x 5 power. Skin: No rashes, lesions or ulcers Psychiatry: Judgement and insight appear normal. Mood & affect appropriate.     Data Reviewed: I have personally reviewed following labs and imaging studies  CBC: Recent Labs  Lab 03/22/20 0047 03/26/20 1153 03/27/20 0631  WBC 17.3* 14.4* 9.9  NEUTROABS 13.8* 10.8*  --  HGB 13.4 16.3* 14.9  HCT 39.3 47.4* 44.4  MCV 93.6 94.0 94.3  PLT 383 346 324   Basic Metabolic Panel: Recent Labs  Lab 03/22/20 0047 03/26/20 1153 03/26/20 2051 03/27/20 0631  NA 139 133*  --  136  K 4.2 3.8  --  4.7  CL 101 103  --  105  CO2 26 21*  --  24  GLUCOSE 165* 118*  --  129*  BUN 12 11  --  13  CREATININE 0.79 0.66  --  0.57  CALCIUM 8.8* 9.1  --  8.9  MG  --   --  1.6*  --    GFR: Estimated Creatinine Clearance: 105.5 mL/min (by C-G formula based on SCr of 0.57 mg/dL). Liver Function Tests: Recent Labs  Lab 03/26/20 1153 03/27/20 0631  AST 20 13*   ALT 33 27  ALKPHOS 54 49  BILITOT 0.8 0.7  PROT 7.2 6.8  ALBUMIN 3.5 3.4*   No results for input(s): LIPASE, AMYLASE in the last 168 hours. No results for input(s): AMMONIA in the last 168 hours. Coagulation Profile: No results for input(s): INR, PROTIME in the last 168 hours. Cardiac Enzymes: No results for input(s): CKTOTAL, CKMB, CKMBINDEX, TROPONINI in the last 168 hours. BNP (last 3 results) No results for input(s): PROBNP in the last 8760 hours. HbA1C: No results for input(s): HGBA1C in the last 72 hours. CBG: No results for input(s): GLUCAP in the last 168 hours. Lipid Profile: No results for input(s): CHOL, HDL, LDLCALC, TRIG, CHOLHDL, LDLDIRECT in the last 72 hours. Thyroid Function Tests: Recent Labs    03/26/20 1155  TSH 0.989   Anemia Panel: No results for input(s): VITAMINB12, FOLATE, FERRITIN, TIBC, IRON, RETICCTPCT in the last 72 hours. Sepsis Labs: No results for input(s): PROCALCITON, LATICACIDVEN in the last 168 hours.  Recent Results (from the past 240 hour(s))  Group A Strep by PCR     Status: None   Collection Time: 03/22/20 12:47 AM   Specimen: Throat; Sterile Swab  Result Value Ref Range Status   Group A Strep by PCR NOT DETECTED NOT DETECTED Final    Comment: Performed at [facility], [address] [place], [place] [address]  SARS Coronavirus 2 by RT PCR (hospital order, performed in [facility] hospital lab) Nasopharyngeal Nasopharyngeal Swab     Status: None   Collection Time: 03/22/20 12:47 AM   Specimen: Nasopharyngeal Swab  Result Value Ref Range Status   SARS Coronavirus 2 NEGATIVE NEGATIVE Final    Comment: (NOTE) SARS-CoV-2 target nucleic acids are NOT DETECTED.  The SARS-CoV-2 RNA is generally detectable in upper and lower respiratory specimens during the acute phase of infection. The lowest concentration of SARS-CoV-2 viral copies this assay can detect is 250 copies / mL. A negative result does not preclude SARS-CoV-2  infection and should not be used as the sole basis for treatment or other patient management decisions.  A negative result may occur with improper specimen collection / handling, submission of specimen other than nasopharyngeal swab, presence of viral mutation(s) within the areas targeted by this assay, and inadequate number of viral copies (<250 copies / mL). A negative result must be combined with clinical observations, patient history, and epidemiological information.  Fact Sheet for Patients:   BoilerBrush.com.cy  Fact Sheet for Healthcare Providers: https://pope.com/  This test is not yet approved or  cleared by the [place] FDA and has been authorized for detection and/or diagnosis of SARS-CoV-2 by  FDA under an Emergency Use Authorization (EUA).  This EUA will remain in effect (meaning this test can be used) for the duration of the COVID-19 declaration under Section 564(b)(1) of the Act, 21 U.S.C. section 360bbb-3(b)(1), unless the authorization is terminated or revoked sooner.  Performed at [facility], [address] [place], [place] [address]   Resp Panel by RT-PCR (Flu A&B, Covid) Nasopharyngeal Swab     Status: None   Collection Time: 03/26/20 12:10 PM   Specimen: Nasopharyngeal Swab; Nasopharyngeal(NP) swabs in vial transport medium  Result Value Ref Range Status   SARS Coronavirus 2 by RT PCR NEGATIVE NEGATIVE Final    Comment: (NOTE) SARS-CoV-2 target nucleic acids are NOT DETECTED.  The SARS-CoV-2 RNA is generally detectable in upper respiratory specimens during the acute phase of infection. The lowest concentration of SARS-CoV-2 viral copies this assay can detect is 138 copies/mL. A negative result does not preclude SARS-Cov-2 infection and should not be used as the sole basis for treatment or other patient management decisions. A negative result may occur with  improper specimen  collection/handling, submission of specimen other than nasopharyngeal swab, presence of viral mutation(s) within the areas targeted by this assay, and inadequate number of viral copies(<138 copies/mL). A negative result must be combined with clinical observations, patient history, and epidemiological information. The expected result is Negative.  Fact Sheet for Patients:  BloggerCourse.com  Fact Sheet for Healthcare Providers:  SeriousBroker.it  This test is no t yet approved or cleared by the [place] FDA and  has been authorized for detection and/or diagnosis of SARS-CoV-2 by FDA under an Emergency Use Authorization (EUA). This EUA will remain  in effect (meaning this test can be used) for the duration of the COVID-19 declaration under Section 564(b)(1) of the Act, 21 U.S.C.section 360bbb-3(b)(1), unless the authorization is terminated  or revoked sooner.       Influenza A by PCR NEGATIVE NEGATIVE Final   Influenza B by PCR NEGATIVE NEGATIVE Final    Comment: (NOTE) The Xpert Xpress SARS-CoV-2/FLU/RSV plus assay is intended as an aid in the diagnosis of influenza from Nasopharyngeal swab specimens and should not be used as a sole basis for treatment. Nasal washings and aspirates are unacceptable for Xpert Xpress SARS-CoV-2/FLU/RSV testing.  Fact Sheet for Patients: BloggerCourse.com  Fact Sheet for Healthcare Providers: SeriousBroker.it  This test is not yet approved or cleared by the [place] FDA and has been authorized for detection and/or diagnosis of SARS-CoV-2 by FDA under an Emergency Use Authorization (EUA). This EUA will remain in effect (meaning this test can be used) for the duration of the COVID-19 declaration under Section 564(b)(1) of the Act, 21 U.S.C. section 360bbb-3(b)(1), unless the authorization is terminated or revoked.  Performed at [facility], [address], [place], [place] [address]          Radiology Studies: DG Chest Portable 1 View  Result Date: 03/26/2020 CLINICAL DATA:  Drug withdrawal EXAM: PORTABLE CHEST 1 VIEW COMPARISON:  None. FINDINGS: The cardiomediastinal silhouette is normal in contour. No pleural effusion. No pneumothorax. No acute pleuroparenchymal abnormality. Visualized abdomen is unremarkable. No acute osseous abnormality noted. Linear structures overlying the upper shoulders likely external to patient. IMPRESSION: No acute cardiopulmonary abnormality. Electronically Signed   By: Meda Klinefelter MD   On: 03/26/2020 11:58        Scheduled Meds: . benztropine  0.5 mg Oral BID  . clonazePAM  1 mg Oral TID  . folic acid  1 mg Oral Daily  . gabapentin  800 mg Oral QID  . haloperidol  10 mg Oral BID  . heparin  5,000 Units Subcutaneous Q8H  . LORazepam  0-4 mg Intravenous Q6H   Followed by  . [START ON 03/28/2020] LORazepam  0-4 mg Intravenous Q12H  . multivitamin with minerals  1 tablet Oral Daily  . QUEtiapine  50 mg Oral QHS  . sertraline  150 mg Oral Daily  . [START ON 03/28/2020] sertraline  150 mg Oral q AM  . thiamine  100 mg Oral Daily   Or  . thiamine  100 mg Intravenous Daily  . traZODone  50 mg Oral QHS   Continuous Infusions:   LOS: 0 days    Time spent:35 mins    Erick Blinks, MD [facility]   If 7PM-7AM, please contact night-coverage [email or web address]  03/27/2020, 9:33 PM

## 2020-03-27 NOTE — TOC Transition Note (Addendum)
Transition of Care [facility]) - CM/SW Discharge Note   Patient Details  Name: Lindsay Miranda MRN: [id number] Date of Birth: 1990-12-20  [facility] CM/SW Contact:  Barry Brunner, LCSW Phone Number: 03/27/2020, 2:00 PM   Clinical Narrative:    CSW discussed patient's discharge plan with patient. Patient reported that she already had a follow up appointment with her PCP and psychiatrist scheduled for Monday. CSW inquired if patient would like assistance with setting up [facility]. Patient reported that she is working with her father to begin treatment with [facility] in patient again, but the plan was to establish in patient rehab after her psych appointment. Patient also expressed difficulty with filling her medications with [facility] while utilizing her medicaid. Patient reported that she had already started the process for [place] medicaid about 30 days ago. CSW informed patient to follow up with [place] medicaid to inquire if she had been approved which would likely lead to her [facility] Medicaid discontinuing. TOC signing off.   Final next level of care: Home/Self Care Barriers to Discharge: Barriers Resolved   Patient Goals and CMS Choice Patient states their goals for this hospitalization and ongoing recovery are:: Return home CMS Medicare.gov Compare Post Acute Care list provided to:: Patient Choice offered to / list presented to : Patient  Discharge Placement                    Patient and family notified of of transfer: 03/27/20  Discharge Plan and Services                DME Arranged: N/A DME Agency: NA       HH Arranged: NA HH Agency: NA        Social Determinants of Health (SDOH) Interventions     Readmission Risk Interventions No flowsheet data found.

## 2020-03-28 DIAGNOSIS — F1123 Opioid dependence with withdrawal: Secondary | ICD-10-CM | POA: Diagnosis not present

## 2020-03-28 DIAGNOSIS — F13230 Sedative, hypnotic or anxiolytic dependence with withdrawal, uncomplicated: Secondary | ICD-10-CM | POA: Diagnosis not present

## 2020-03-28 MED ORDER — BENZTROPINE MESYLATE 0.5 MG PO TABS: 0.5000 mg | ORAL_TABLET | Freq: Two times a day (BID) | ORAL | 0 refills | Status: DC

## 2020-03-28 MED ORDER — SERTRALINE HCL 100 MG PO TABS: 100.0000 mg | ORAL_TABLET | Freq: Every morning | ORAL | 0 refills | Status: AC

## 2020-03-28 MED ORDER — CLONAZEPAM 1 MG PO TABS: 1.0000 mg | ORAL_TABLET | Freq: Three times a day (TID) | ORAL | 0 refills | Status: DC | PRN

## 2020-03-28 MED ORDER — SERTRALINE HCL 50 MG PO TABS
50.0000 mg | ORAL_TABLET | Freq: Every morning | ORAL | 0 refills | Status: AC
Start: 2020-03-28 — End: ?

## 2020-03-28 MED ORDER — QUETIAPINE FUMARATE 50 MG PO TABS: 50.0000 mg | ORAL_TABLET | Freq: Every day | ORAL | 0 refills | Status: AC

## 2020-03-28 MED ORDER — GABAPENTIN 800 MG PO TABS
800.0000 mg | ORAL_TABLET | Freq: Four times a day (QID) | ORAL | 0 refills | Status: AC
Start: 2020-03-28 — End: ?

## 2020-03-28 MED ORDER — HALOPERIDOL 10 MG PO TABS: 10.0000 mg | ORAL_TABLET | Freq: Two times a day (BID) | ORAL | 0 refills | Status: DC

## 2020-03-28 MED ORDER — CLONIDINE HCL 0.1 MG PO TABS: ORAL_TABLET | ORAL | 0 refills | Status: DC

## 2020-03-28 NOTE — Discharge Summary (Signed)
Physician Discharge Summary  Lindsay Miranda WUJ:[id number] DOB: 04/09/1990 DOA: 03/26/2020  PCP: Patient, No Pcp Per  Admit date: 03/26/2020 Discharge date: 03/28/2020  Admitted From: Home Disposition: Home  Recommendations for Outpatient Follow-up:  1. Follow up with PCP in 1-2 weeks 2. Please obtain BMP/CBC in one week 3. Plans are for outpatient follow-up at [facility] to reestablish with a mental health provider  Home Health: Equipment/Devices:  Discharge Condition: Stable CODE STATUS: Full code Diet recommendation: Heart healthy  Brief/Interim Summary: 30 year old female with a history of substance abuse, bipolar disorder, anxiety, who is chronically on benzodiazepines, Subutex, amongst other medications.  She recently moved to the area from [place] to detox of medications.  When she had checked herself into detox, she was noted to be tachycardic with heart rate in the 120s 130s.  She was sent to the ER for evaluation where she was subsequently admitted for likely benzodiazepine withdrawal.  Discharge Diagnoses:  Active Problems:   Withdrawal from benzodiazepine [facility](HCC)  Sinus tachycardia -Likely secondary to withdrawal -Work-up including TSH, D-dimer and troponin were negative -No evidence of infection -With resumption of Klonopin, Prozac tachycardia has resolved  Benzodiazepine withdrawal -Patient was chronically taking Klonopin which was abruptly discontinued recently -She was restarted on her home dose of Klonopin -She plans on following up with [facility] to continue her care -She recently moved from [place] and currently does not have an established provider -I have given her a 2 to 3-day supply of Klonopin so as to not precipitate further withdrawal until she can establish care  Withdrawal from Subutex -Patient does not wish to restart this or any opiates -She was started on clonidine withdrawal protocol and reported improvement in symptoms  Discharge  Instructions  Discharge Instructions    Diet - low sodium heart healthy   Complete by: As directed    Increase activity slowly   Complete by: As directed      Allergies as of 03/28/2020      Reactions   Risperdal [risperidone] Other (See Comments)   Jaw locks.    Suboxone [buprenorphine Hcl-naloxone Hcl] Nausea And Vomiting   Violent throwing up.       Medication List    STOP taking these medications   buprenorphine 8 MG Subl SL tablet Commonly known as: SUBUTEX   cyclobenzaprine 5 MG tablet Commonly known as: FLEXERIL   predniSONE 20 MG tablet Commonly known as: DELTASONE   traZODone 50 MG tablet Commonly known as: DESYREL     TAKE these medications   AIRBORNE GUMMIES PO Take 1 tablet by mouth daily.   benztropine 0.5 MG tablet Commonly known as: COGENTIN Take 1 tablet (0.5 mg total) by mouth 2 (two) times daily.   clonazePAM 1 MG tablet Commonly known as: KLONOPIN Take 1 tablet (1 mg total) by mouth 3 (three) times daily as needed for anxiety. What changed:   how much to take  when to take this  reasons to take this   cloNIDine 0.1 MG tablet Commonly known as: CATAPRES Take 1 tab po bid for 2 days, then 1 tab po daily for 2 days then stop   gabapentin 800 MG tablet Commonly known as: NEURONTIN Take 1 tablet (800 mg total) by mouth 4 (four) times daily.   haloperidol 10 MG tablet Commonly known as: HALDOL Take 1 tablet (10 mg total) by mouth 2 (two) times daily.   PRENATAL GUMMIES PO Take 1 tablet by mouth daily.   QUEtiapine 50 MG tablet Commonly known  as: SEROQUEL Take 1 tablet (50 mg total) by mouth at bedtime.   sertraline 100 MG tablet Commonly known as: ZOLOFT Take 1 tablet (100 mg total) by mouth in the morning. Take with 50mg  tablet every morning to equal 150mg    sertraline 50 MG tablet Commonly known as: ZOLOFT Take 1 tablet (50 mg total) by mouth in the morning. Take with 100mg  tablet every morning to equal 150mg         Follow-up Information    [facility] EMERGENCY DEPARTMENT.   Specialty: Emergency Medicine Why: Return as needed for new or worsening symptoms Contact information: [address] [id number] [place] [address] [phone]             Allergies  Allergen Reactions  . Risperdal [Risperidone] Other (See Comments)    Jaw locks.   . Suboxone [Buprenorphine Hcl-Naloxone Hcl] Nausea And Vomiting    Violent throwing up.     Consultations:     Procedures/Studies: DG Chest Portable 1 View  Result Date: 03/26/2020 CLINICAL DATA:  Drug withdrawal EXAM: PORTABLE CHEST 1 VIEW COMPARISON:  None. FINDINGS: The cardiomediastinal silhouette is normal in contour. No pleural effusion. No pneumothorax. No acute pleuroparenchymal abnormality. Visualized abdomen is unremarkable. No acute osseous abnormality noted. Linear structures overlying the upper shoulders likely external to patient. IMPRESSION: No acute cardiopulmonary abnormality. Electronically Signed   By: Meda Klinefelter MD   On: 03/26/2020 11:58       Subjective: Feeling better today.  No palpitations.  Discharge Exam: Vitals:   03/28/20 0000 03/28/20 0532 03/28/20 0918 03/28/20 1314  BP: 116/82 116/68 91/63 (!) 101/52  Pulse: (!) 105 87 80 87  Resp: 18 16 18 18   Temp: 98.6 F (37 C) (!) 97 F (36.1 C) 97.7 F (36.5 C) (!) 97.5 F (36.4 C)  TempSrc: Axillary  Oral Oral  SpO2: 98% 96% 98% 100%  Weight:      Height:        General: Pt is alert, awake, not in acute distress Cardiovascular: RRR, S1/S2 +, no rubs, no gallops Respiratory: CTA bilaterally, no wheezing, no rhonchi Abdominal: Soft, NT, ND, bowel sounds + Extremities: no edema, no cyanosis    The results of significant diagnostics from this hospitalization (including imaging, microbiology, ancillary and laboratory) are listed below for reference.     Microbiology: Recent Results (from the past 240 hour(s))  Group A Strep by PCR      Status: None   Collection Time: 03/22/20 12:47 AM   Specimen: Throat; Sterile Swab  Result Value Ref Range Status   Group A Strep by PCR NOT DETECTED NOT DETECTED Final    Comment: Performed at [facility], [address] [place], [place] [address]  SARS Coronavirus 2 by RT PCR (hospital order, performed in [facility] hospital lab) Nasopharyngeal Nasopharyngeal Swab     Status: None   Collection Time: 03/22/20 12:47 AM   Specimen: Nasopharyngeal Swab  Result Value Ref Range Status   SARS Coronavirus 2 NEGATIVE NEGATIVE Final    Comment: (NOTE) SARS-CoV-2 target nucleic acids are NOT DETECTED.  The SARS-CoV-2 RNA is generally detectable in upper and lower respiratory specimens during the acute phase of infection. The lowest concentration of SARS-CoV-2 viral copies this assay can detect is 250 copies / mL. A negative result does not preclude SARS-CoV-2 infection and should not be used as the sole basis for treatment or other patient management decisions.  A negative result may occur with improper  specimen collection / handling, submission of specimen other than nasopharyngeal swab, presence of viral mutation(s) within the areas targeted by this assay, and inadequate number of viral copies (<250 copies / mL). A negative result must be combined with clinical observations, patient history, and epidemiological information.  Fact Sheet for Patients:   BoilerBrush.com.cy  Fact Sheet for Healthcare Providers: https://pope.com/  This test is not yet approved or  cleared by the [place] FDA and has been authorized for detection and/or diagnosis of SARS-CoV-2 by FDA under an Emergency Use Authorization (EUA).  This EUA will remain in effect (meaning this test can be used) for the duration of the COVID-19 declaration under Section 564(b)(1) of the Act, 21 U.S.C. section 360bbb-3(b)(1), unless the authorization is terminated  or revoked sooner.  Performed at [facility], [address] [place], [place] [address]   Resp Panel by RT-PCR (Flu A&B, Covid) Nasopharyngeal Swab     Status: None   Collection Time: 03/26/20 12:10 PM   Specimen: Nasopharyngeal Swab; Nasopharyngeal(NP) swabs in vial transport medium  Result Value Ref Range Status   SARS Coronavirus 2 by RT PCR NEGATIVE NEGATIVE Final    Comment: (NOTE) SARS-CoV-2 target nucleic acids are NOT DETECTED.  The SARS-CoV-2 RNA is generally detectable in upper respiratory specimens during the acute phase of infection. The lowest concentration of SARS-CoV-2 viral copies this assay can detect is 138 copies/mL. A negative result does not preclude SARS-Cov-2 infection and should not be used as the sole basis for treatment or other patient management decisions. A negative result may occur with  improper specimen collection/handling, submission of specimen other than nasopharyngeal swab, presence of viral mutation(s) within the areas targeted by this assay, and inadequate number of viral copies(<138 copies/mL). A negative result must be combined with clinical observations, patient history, and epidemiological information. The expected result is Negative.  Fact Sheet for Patients:  BloggerCourse.com  Fact Sheet for Healthcare Providers:  SeriousBroker.it  This test is no t yet approved or cleared by the [place] FDA and  has been authorized for detection and/or diagnosis of SARS-CoV-2 by FDA under an Emergency Use Authorization (EUA). This EUA will remain  in effect (meaning this test can be used) for the duration of the COVID-19 declaration under Section 564(b)(1) of the Act, 21 U.S.C.section 360bbb-3(b)(1), unless the authorization is terminated  or revoked sooner.       Influenza A by PCR NEGATIVE NEGATIVE Final   Influenza B by PCR NEGATIVE NEGATIVE Final    Comment: (NOTE) The  Xpert Xpress SARS-CoV-2/FLU/RSV plus assay is intended as an aid in the diagnosis of influenza from Nasopharyngeal swab specimens and should not be used as a sole basis for treatment. Nasal washings and aspirates are unacceptable for Xpert Xpress SARS-CoV-2/FLU/RSV testing.  Fact Sheet for Patients: BloggerCourse.com  Fact Sheet for Healthcare Providers: SeriousBroker.it  This test is not yet approved or cleared by the [place] FDA and has been authorized for detection and/or diagnosis of SARS-CoV-2 by FDA under an Emergency Use Authorization (EUA). This EUA will remain in effect (meaning this test can be used) for the duration of the COVID-19 declaration under Section 564(b)(1) of the Act, 21 U.S.C. section 360bbb-3(b)(1), unless the authorization is terminated or revoked.  Performed at [facility], [address], [place], [place] [address]      Labs: BNP (last 3 results) No results for input(s): BNP in the last 8760 hours. Basic Metabolic Panel: Recent Labs  Lab 03/22/20 0047  03/26/20 1153 03/26/20 2051 03/27/20 0631  NA 139 133*  --  136  K 4.2 3.8  --  4.7  CL 101 103  --  105  CO2 26 21*  --  24  GLUCOSE 165* 118*  --  129*  BUN 12 11  --  13  CREATININE 0.79 0.66  --  0.57  CALCIUM 8.8* 9.1  --  8.9  MG  --   --  1.6*  --    Liver Function Tests: Recent Labs  Lab 03/26/20 1153 03/27/20 0631  AST 20 13*  ALT 33 27  ALKPHOS 54 49  BILITOT 0.8 0.7  PROT 7.2 6.8  ALBUMIN 3.5 3.4*   No results for input(s): LIPASE, AMYLASE in the last 168 hours. No results for input(s): AMMONIA in the last 168 hours. CBC: Recent Labs  Lab 03/22/20 0047 03/26/20 1153 03/27/20 0631  WBC 17.3* 14.4* 9.9  NEUTROABS 13.8* 10.8*  --   HGB 13.4 16.3* 14.9  HCT 39.3 47.4* 44.4  MCV 93.6 94.0 94.3  PLT 383 346 324   Cardiac Enzymes: No results for input(s): CKTOTAL, CKMB, CKMBINDEX, TROPONINI in the last 168  hours. BNP: Invalid input(s): POCBNP CBG: No results for input(s): GLUCAP in the last 168 hours. D-Dimer Recent Labs    03/26/20 1835  DDIMER <0.27   Hgb A1c No results for input(s): HGBA1C in the last 72 hours. Lipid Profile No results for input(s): CHOL, HDL, LDLCALC, TRIG, CHOLHDL, LDLDIRECT in the last 72 hours. Thyroid function studies Recent Labs    03/26/20 1155  TSH 0.989   Anemia work up No results for input(s): VITAMINB12, FOLATE, FERRITIN, TIBC, IRON, RETICCTPCT in the last 72 hours. Urinalysis    Component Value Date/Time   COLORURINE YELLOW 03/26/2020 2042   APPEARANCEUR CLOUDY (A) 03/26/2020 2042   LABSPEC 1.014 03/26/2020 2042   PHURINE 9.0 (H) 03/26/2020 2042   GLUCOSEU NEGATIVE 03/26/2020 2042   HGBUR NEGATIVE 03/26/2020 2042   BILIRUBINUR NEGATIVE 03/26/2020 2042   KETONESUR NEGATIVE 03/26/2020 2042   PROTEINUR NEGATIVE 03/26/2020 2042   NITRITE NEGATIVE 03/26/2020 2042   LEUKOCYTESUR NEGATIVE 03/26/2020 2042   Sepsis Labs Invalid input(s): PROCALCITONIN,  WBC,  LACTICIDVEN Microbiology Recent Results (from the past 240 hour(s))  Group A Strep by PCR     Status: None   Collection Time: 03/22/20 12:47 AM   Specimen: Throat; Sterile Swab  Result Value Ref Range Status   Group A Strep by PCR NOT DETECTED NOT DETECTED Final    Comment: Performed at [facility], [address] [place], [place] [address]  SARS Coronavirus 2 by RT PCR (hospital order, performed in [facility] hospital lab) Nasopharyngeal Nasopharyngeal Swab     Status: None   Collection Time: 03/22/20 12:47 AM   Specimen: Nasopharyngeal Swab  Result Value Ref Range Status   SARS Coronavirus 2 NEGATIVE NEGATIVE Final    Comment: (NOTE) SARS-CoV-2 target nucleic acids are NOT DETECTED.  The SARS-CoV-2 RNA is generally detectable in upper and lower respiratory specimens during the acute phase of infection. The lowest concentration of SARS-CoV-2 viral copies this assay can  detect is 250 copies / mL. A negative result does not preclude SARS-CoV-2 infection and should not be used as the sole basis for treatment or other patient management decisions.  A negative result may occur with improper specimen collection / handling, submission of specimen other than nasopharyngeal swab, presence of viral mutation(s) within the areas targeted by this assay,  and inadequate number of viral copies (<250 copies / mL). A negative result must be combined with clinical observations, patient history, and epidemiological information.  Fact Sheet for Patients:   BoilerBrush.com.cy  Fact Sheet for Healthcare Providers: https://pope.com/  This test is not yet approved or  cleared by the [place] FDA and has been authorized for detection and/or diagnosis of SARS-CoV-2 by FDA under an Emergency Use Authorization (EUA).  This EUA will remain in effect (meaning this test can be used) for the duration of the COVID-19 declaration under Section 564(b)(1) of the Act, 21 U.S.C. section 360bbb-3(b)(1), unless the authorization is terminated or revoked sooner.  Performed at [facility], [address] [place], [place] [address]   Resp Panel by RT-PCR (Flu A&B, Covid) Nasopharyngeal Swab     Status: None   Collection Time: 03/26/20 12:10 PM   Specimen: Nasopharyngeal Swab; Nasopharyngeal(NP) swabs in vial transport medium  Result Value Ref Range Status   SARS Coronavirus 2 by RT PCR NEGATIVE NEGATIVE Final    Comment: (NOTE) SARS-CoV-2 target nucleic acids are NOT DETECTED.  The SARS-CoV-2 RNA is generally detectable in upper respiratory specimens during the acute phase of infection. The lowest concentration of SARS-CoV-2 viral copies this assay can detect is 138 copies/mL. A negative result does not preclude SARS-Cov-2 infection and should not be used as the sole basis for treatment or other patient management  decisions. A negative result may occur with  improper specimen collection/handling, submission of specimen other than nasopharyngeal swab, presence of viral mutation(s) within the areas targeted by this assay, and inadequate number of viral copies(<138 copies/mL). A negative result must be combined with clinical observations, patient history, and epidemiological information. The expected result is Negative.  Fact Sheet for Patients:  BloggerCourse.com  Fact Sheet for Healthcare Providers:  SeriousBroker.it  This test is no t yet approved or cleared by the [place] FDA and  has been authorized for detection and/or diagnosis of SARS-CoV-2 by FDA under an Emergency Use Authorization (EUA). This EUA will remain  in effect (meaning this test can be used) for the duration of the COVID-19 declaration under Section 564(b)(1) of the Act, 21 U.S.C.section 360bbb-3(b)(1), unless the authorization is terminated  or revoked sooner.       Influenza A by PCR NEGATIVE NEGATIVE Final   Influenza B by PCR NEGATIVE NEGATIVE Final    Comment: (NOTE) The Xpert Xpress SARS-CoV-2/FLU/RSV plus assay is intended as an aid in the diagnosis of influenza from Nasopharyngeal swab specimens and should not be used as a sole basis for treatment. Nasal washings and aspirates are unacceptable for Xpert Xpress SARS-CoV-2/FLU/RSV testing.  Fact Sheet for Patients: BloggerCourse.com  Fact Sheet for Healthcare Providers: SeriousBroker.it  This test is not yet approved or cleared by the [place] FDA and has been authorized for detection and/or diagnosis of SARS-CoV-2 by FDA under an Emergency Use Authorization (EUA). This EUA will remain in effect (meaning this test can be used) for the duration of the COVID-19 declaration under Section 564(b)(1) of the Act, 21 U.S.C. section 360bbb-3(b)(1), unless the  authorization is terminated or revoked.  Performed at [facility], [address], [place], [place] [address]      Time coordinating discharge:  SIGNED:   Erick Blinks, MD  [facility] 03/28/2020, 7:31 PM   If 7PM-7AM, please contact night-coverage [email or web address]

## 2020-04-09 ENCOUNTER — Emergency Department (HOSPITAL_BASED_OUTPATIENT_CLINIC_OR_DEPARTMENT_OTHER): Payer: Medicaid - Out of State

## 2020-04-09 ENCOUNTER — Other Ambulatory Visit: Payer: Self-pay

## 2020-04-09 ENCOUNTER — Inpatient Hospital Stay (HOSPITAL_BASED_OUTPATIENT_CLINIC_OR_DEPARTMENT_OTHER)
Admission: EM | Admit: 2020-04-09 | Discharge: 2020-04-13 | DRG: 871 | Disposition: A | Discharge status: Home / Self Care | Payer: Medicaid - Out of State | Attending: Family Medicine | Admitting: Family Medicine

## 2020-04-09 ENCOUNTER — Encounter (HOSPITAL_BASED_OUTPATIENT_CLINIC_OR_DEPARTMENT_OTHER): Payer: Self-pay | Admitting: *Deleted

## 2020-04-09 DIAGNOSIS — A419 Sepsis, unspecified organism: Secondary | ICD-10-CM

## 2020-04-09 DIAGNOSIS — F419 Anxiety disorder, unspecified: Secondary | ICD-10-CM | POA: Diagnosis present

## 2020-04-09 DIAGNOSIS — I10 Essential (primary) hypertension: Secondary | ICD-10-CM | POA: Diagnosis present

## 2020-04-09 DIAGNOSIS — F1210 Cannabis abuse, uncomplicated: Secondary | ICD-10-CM | POA: Diagnosis present

## 2020-04-09 DIAGNOSIS — R079 Chest pain, unspecified: Secondary | ICD-10-CM

## 2020-04-09 DIAGNOSIS — J189 Pneumonia, unspecified organism: Secondary | ICD-10-CM | POA: Diagnosis present

## 2020-04-09 DIAGNOSIS — F17210 Nicotine dependence, cigarettes, uncomplicated: Secondary | ICD-10-CM | POA: Diagnosis present

## 2020-04-09 DIAGNOSIS — R6520 Severe sepsis without septic shock: Secondary | ICD-10-CM | POA: Diagnosis present

## 2020-04-09 DIAGNOSIS — F1510 Other stimulant abuse, uncomplicated: Secondary | ICD-10-CM | POA: Diagnosis present

## 2020-04-09 DIAGNOSIS — J9601 Acute respiratory failure with hypoxia: Secondary | ICD-10-CM | POA: Diagnosis present

## 2020-04-09 DIAGNOSIS — Z20822 Contact with and (suspected) exposure to covid-19: Secondary | ICD-10-CM | POA: Diagnosis present

## 2020-04-09 DIAGNOSIS — Z720 Tobacco use: Secondary | ICD-10-CM

## 2020-04-09 DIAGNOSIS — Z79899 Other long term (current) drug therapy: Secondary | ICD-10-CM

## 2020-04-09 DIAGNOSIS — F332 Major depressive disorder, recurrent severe without psychotic features: Secondary | ICD-10-CM | POA: Diagnosis present

## 2020-04-09 DIAGNOSIS — E872 Acidosis: Secondary | ICD-10-CM | POA: Diagnosis present

## 2020-04-09 DIAGNOSIS — F1310 Sedative, hypnotic or anxiolytic abuse, uncomplicated: Secondary | ICD-10-CM | POA: Diagnosis present

## 2020-04-09 DIAGNOSIS — J439 Emphysema, unspecified: Secondary | ICD-10-CM | POA: Diagnosis present

## 2020-04-09 LAB — URINALYSIS, ROUTINE W REFLEX MICROSCOPIC
Bilirubin Urine: NEGATIVE
Glucose, UA: NEGATIVE mg/dL
Ketones, ur: NEGATIVE mg/dL
Leukocytes,Ua: NEGATIVE
Nitrite: NEGATIVE
Protein, ur: NEGATIVE mg/dL
Specific Gravity, Urine: 1.005 (ref 1.005–1.030)
pH: 6 (ref 5.0–8.0)

## 2020-04-09 LAB — CBC WITH DIFFERENTIAL/PLATELET
Abs Immature Granulocytes: 0.06 10*3/uL (ref 0.00–0.07)
Basophils Absolute: 0.1 10*3/uL (ref 0.0–0.1)
Basophils Relative: 1 %
Eosinophils Absolute: 0.1 10*3/uL (ref 0.0–0.5)
Eosinophils Relative: 1 %
HCT: 39 % (ref 36.0–46.0)
Hemoglobin: 13.9 g/dL (ref 12.0–15.0)
Immature Granulocytes: 1 %
Lymphocytes Relative: 14 %
Lymphs Abs: 1.1 10*3/uL (ref 0.7–4.0)
MCH: 32.6 pg (ref 26.0–34.0)
MCHC: 35.6 g/dL (ref 30.0–36.0)
MCV: 91.5 fL (ref 80.0–100.0)
Monocytes Absolute: 0.8 10*3/uL (ref 0.1–1.0)
Monocytes Relative: 10 %
Neutro Abs: 5.7 10*3/uL (ref 1.7–7.7)
Neutrophils Relative %: 73 %
Platelets: 254 10*3/uL (ref 150–400)
RBC: 4.26 MIL/uL (ref 3.87–5.11)
RDW: 12.8 % (ref 11.5–15.5)
Smear Review: NORMAL
WBC: 7.6 10*3/uL (ref 4.0–10.5)
nRBC: 0 % (ref 0.0–0.2)

## 2020-04-09 LAB — COMPREHENSIVE METABOLIC PANEL
ALT: 36 U/L (ref 0–44)
AST: 69 U/L — ABNORMAL HIGH (ref 15–41)
Albumin: 3 g/dL — ABNORMAL LOW (ref 3.5–5.0)
Alkaline Phosphatase: 55 U/L (ref 38–126)
Anion gap: 9 (ref 5–15)
BUN: 12 mg/dL (ref 6–20)
CO2: 22 mmol/L (ref 22–32)
Calcium: 8.3 mg/dL — ABNORMAL LOW (ref 8.9–10.3)
Chloride: 102 mmol/L (ref 98–111)
Creatinine, Ser: 0.9 mg/dL (ref 0.44–1.00)
GFR, Estimated: >60 mL/min (ref >60)
Glucose, Bld: 113 mg/dL — ABNORMAL HIGH (ref 70–99)
Potassium: 4.1 mmol/L (ref 3.5–5.1)
Sodium: 133 mmol/L — ABNORMAL LOW (ref 135–145)
Total Bilirubin: 0.8 mg/dL (ref 0.3–1.2)
Total Protein: 6.1 g/dL — ABNORMAL LOW (ref 6.5–8.1)

## 2020-04-09 LAB — RESP PANEL BY RT-PCR (FLU A&B, COVID) ARPGX2
Influenza A by PCR: NEGATIVE
Influenza B by PCR: NEGATIVE
SARS Coronavirus 2 by RT PCR: NEGATIVE

## 2020-04-09 LAB — URINALYSIS, MICROSCOPIC (REFLEX)

## 2020-04-09 LAB — TROPONIN I (HIGH SENSITIVITY)
Troponin I (High Sensitivity): 25 ng/L — ABNORMAL HIGH (ref <18)
Troponin I (High Sensitivity): 27 ng/L — ABNORMAL HIGH (ref <18)

## 2020-04-09 LAB — LACTIC ACID, PLASMA
Lactic Acid, Venous: 2.1 mmol/L (ref 0.5–1.9)
Lactic Acid, Venous: 2.1 mmol/L (ref 0.5–1.9)

## 2020-04-09 LAB — PREGNANCY, URINE: Preg Test, Ur: NEGATIVE

## 2020-04-09 MED ORDER — IOHEXOL 350 MG/ML SOLN
100.0000 mL | Freq: Once | INTRAVENOUS | Status: AC | PRN
  Administered 2020-04-09: 100 mL via INTRAVENOUS

## 2020-04-09 MED ORDER — LORAZEPAM 2 MG/ML IJ SOLN
0.5000 mg | Freq: Once | INTRAMUSCULAR | Status: AC
  Administered 2020-04-09: 0.5 mg via INTRAVENOUS
  Filled 2020-04-09: qty 1

## 2020-04-09 MED ORDER — SODIUM CHLORIDE 0.9 % IV BOLUS
1000.0000 mL | Freq: Once | INTRAVENOUS | Status: AC
  Administered 2020-04-09: 1000 mL via INTRAVENOUS

## 2020-04-09 MED ORDER — METHYLPREDNISOLONE SODIUM SUCC 125 MG IJ SOLR
125.0000 mg | Freq: Once | INTRAMUSCULAR | Status: AC
  Administered 2020-04-09: 125 mg via INTRAVENOUS
  Filled 2020-04-09: qty 2

## 2020-04-09 MED ORDER — SODIUM CHLORIDE 0.9 % IV SOLN
500.0000 mg | Freq: Once | INTRAVENOUS | Status: AC
  Administered 2020-04-09: 500 mg via INTRAVENOUS
  Filled 2020-04-09: qty 500

## 2020-04-09 MED ORDER — ALBUTEROL SULFATE HFA 108 (90 BASE) MCG/ACT IN AERS
6.0000 | INHALATION_SPRAY | Freq: Once | RESPIRATORY_TRACT | Status: AC
  Administered 2020-04-09: 6 via RESPIRATORY_TRACT
  Filled 2020-04-09: qty 6.7

## 2020-04-09 MED ORDER — IPRATROPIUM BROMIDE HFA 17 MCG/ACT IN AERS
2.0000 | INHALATION_SPRAY | Freq: Once | RESPIRATORY_TRACT | Status: AC
  Administered 2020-04-09: 2 via RESPIRATORY_TRACT
  Filled 2020-04-09: qty 12.9

## 2020-04-09 MED ORDER — SODIUM CHLORIDE 0.9 % IV SOLN
1.0000 g | Freq: Once | INTRAVENOUS | Status: AC
  Administered 2020-04-09: 1 g via INTRAVENOUS
  Filled 2020-04-09: qty 10

## 2020-04-09 MED ORDER — ACETAMINOPHEN 325 MG PO TABS
650.0000 mg | ORAL_TABLET | Freq: Once | ORAL | Status: AC
  Administered 2020-04-09: 650 mg via ORAL
  Filled 2020-04-09: qty 2

## 2020-04-09 NOTE — ED Notes (Signed)
Pt via pov from home with cough, fever since yesterday. Pt has been taking ibuprofen (last dose 2 hours ago). Pt has congested sounding cough during assessment. Denies any known exposure to covid. Pt not vaccinated, has had prior infection of covid. Pt alert & oriented, nad noted.

## 2020-04-09 NOTE — ED Notes (Signed)
Pt O2 sat hovering between 90 & 92%; placed pt on 2L O2 via [place].   PA Cortni at bedside.

## 2020-04-09 NOTE — ED Provider Notes (Addendum)
[facility] EMERGENCY DEPARTMENT Provider Note   CSN: [id number] Arrival date & time: 04/09/20  1613     History Chief Complaint  Patient presents with  . Cough  . Fever    Lindsay Miranda is a 30 y.o. female.  HPI   30 year old female with a history of anxiety, bipolar 1 disorder, heroin abuse, hypertension, renal disorder, who presents to the emergency department today for evaluation of cough and fever.  States that she has had a cough for the last 2 days.  She is coughing up brown mucus.  She further reports chest pain, pleuritic pain, shortness of breath and fevers for the same time period.  She denies any vomiting or diarrhea.  Denies any known sick contacts.  States she has a history of pneumonitis and pneumonia.  States feels like pneumonia.  She is not vaccinated against Covid.  Past Medical History:  Diagnosis Date  . Anxiety   . Bipolar 1 disorder, depressed (HCC)   . Heroin abuse (HCC)   . Hypertension   . Renal disorder     Patient Active Problem List   Diagnosis Date Noted  . Pneumonia 04/09/2020  . Withdrawal from benzodiazepine (HCC) 03/26/2020  . Polysubstance dependence including opioid type drug with complication, episodic abuse, with unspecified complication (HCC)   . MDD (major depressive disorder), recurrent severe, without psychosis (HCC)   . 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 KIDNEY SURGERY Right 2016     OB History    Gravida  1   Para      Term      Preterm      AB      Living        SAB      IAB      Ectopic      Multiple      Live Births              No family history on file.  Social History   Tobacco Use  . Smoking status: Current Every Day Smoker    Packs/day: 1.00    Types: Cigarettes  . Smokeless tobacco: Never Used  Substance Use Topics  . Alcohol use: Yes  . Drug use: Yes    Home Medications Prior to Admission medications    Medication Sig Start Date End Date Taking? Authorizing Provider  clonazePAM (KLONOPIN) 1 MG tablet Take 1 tablet (1 mg total) by mouth 3 (three) times daily as needed for anxiety. 03/28/20  Yes Erick Blinks, MD  cloNIDine (CATAPRES) 0.1 MG tablet Take 1 tab po bid for 2 days, then 1 tab po daily for 2 days then stop 03/28/20  Yes Erick Blinks, MD  gabapentin (NEURONTIN) 800 MG tablet Take 1 tablet (800 mg total) by mouth 4 (four) times daily. 03/28/20  Yes Erick Blinks, MD  haloperidol (HALDOL) 10 MG tablet Take 1 tablet (10 mg total) by mouth 2 (two) times daily. 03/28/20  Yes Erick Blinks, MD  Multiple Vitamins-Minerals (AIRBORNE GUMMIES PO) Take 1 tablet by mouth daily.   Yes [provider]  QUEtiapine (SEROQUEL) 50 MG tablet Take 1 tablet (50 mg total) by mouth at bedtime. 03/28/20  Yes Erick Blinks, MD  sertraline (ZOLOFT) 100 MG tablet Take 1 tablet (100 mg total) by mouth in the morning. Take with 50mg  tablet every morning to equal 150mg  03/28/20  Yes , MD  sertraline (ZOLOFT) 50 MG tablet  Take 1 tablet (50 mg total) by mouth in the morning. Take with 100mg  tablet every morning to equal 150mg  03/28/20  Yes Memon, , MD  benztropine (COGENTIN) 0.5 MG tablet Take 1 tablet (0.5 mg total) by mouth 2 (two) times daily. 03/28/20   Durward Mallard, MD  Prenatal MV & Min w/FA-DHA (PRENATAL GUMMIES PO) Take 1 tablet by mouth daily.    [provider]    Allergies    Risperdal [risperidone] and Suboxone [buprenorphine hcl-naloxone hcl]  Review of Systems   Review of Systems  Constitutional: Positive for chills, diaphoresis, fatigue and fever.  HENT: Negative for ear pain and sore throat.   Eyes: Negative for pain and visual disturbance.  Respiratory: Positive for cough and shortness of breath.   Cardiovascular: Positive for chest pain.  Gastrointestinal: Negative for abdominal pain, constipation, diarrhea, nausea and vomiting.  Genitourinary: Negative  for dysuria and hematuria.  Musculoskeletal: Negative for back pain.  Skin: Negative for rash.  Neurological: Negative for headaches.  All other systems reviewed and are negative.   Physical Exam Updated Vital Signs BP 104/74 (BP Location: Left Arm)   Pulse (!) 115   Temp 98.6 F (37 C) (Oral)   Resp 20   Ht 5\' 5"  (1.651 m)   Wt 74.8 kg   SpO2 95%   BMI 27.46 kg/m   Physical Exam Vitals and nursing note reviewed.  Constitutional:      General: She is not in acute distress.    Appearance: She is well-developed and well-nourished.  HENT:     Head: Normocephalic and atraumatic.  Eyes:     Conjunctiva/sclera: Conjunctivae normal.  Cardiovascular:     Rate and Rhythm: Regular rhythm. Tachycardia present.     Heart sounds: Normal heart sounds. No murmur heard.   Pulmonary:     Effort: Pulmonary effort is normal. No respiratory distress.     Comments: Wheezing and course breath sounds throughout. Wet cough on exam Abdominal:     General: Bowel sounds are normal.     Palpations: Abdomen is soft.     Tenderness: There is no abdominal tenderness. There is no guarding or rebound.  Musculoskeletal:        General: No edema.     Cervical back: Neck supple.  Skin:    General: Skin is warm and dry.  Neurological:     Mental Status: She is alert.  Psychiatric:        Mood and Affect: Mood and affect normal.     ED Results / Procedures / Treatments   Labs (all labs ordered are listed, but only abnormal results are displayed) Labs Reviewed  COMPREHENSIVE METABOLIC PANEL - Abnormal; Notable for the following components:      Result Value   Sodium 133 (*)    Glucose, Bld 113 (*)    Calcium 8.3 (*)    Total Protein 6.1 (*)    Albumin 3.0 (*)    AST 69 (*)    All other components within normal limits  LACTIC ACID, PLASMA - Abnormal; Notable for the following components:   Lactic Acid, Venous 2.1 (*)    All other components within normal limits  LACTIC ACID, PLASMA -  Abnormal; Notable for the following components:   Lactic Acid, Venous 2.1 (*)    All other components within normal limits  URINALYSIS, ROUTINE W REFLEX MICROSCOPIC - Abnormal; Notable for the following components:   APPearance HAZY (*)    Hgb urine dipstick TRACE (*)  All other components within normal limits  URINALYSIS, MICROSCOPIC (REFLEX) - Abnormal; Notable for the following components:   Bacteria, UA RARE (*)    All other components within normal limits  TROPONIN I (HIGH SENSITIVITY) - Abnormal; Notable for the following components:   Troponin I (High Sensitivity) 25 (*)    All other components within normal limits  TROPONIN I (HIGH SENSITIVITY) - Abnormal; Notable for the following components:   Troponin I (High Sensitivity) 27 (*)    All other components within normal limits  RESP PANEL BY RT-PCR (FLU A&B, COVID) ARPGX2  CULTURE, BLOOD (ROUTINE X 2)  CULTURE, BLOOD (ROUTINE X 2)  CBC WITH DIFFERENTIAL/PLATELET  PREGNANCY, URINE  PATHOLOGIST SMEAR REVIEW    EKG None  Radiology CT Angio Chest PE W and/or Wo Contrast  Result Date: 04/09/2020 CLINICAL DATA:  Cough, fever and chest pain. EXAM: CT ANGIOGRAPHY CHEST WITH CONTRAST TECHNIQUE: Multidetector CT imaging of the chest was performed using the standard protocol during bolus administration of intravenous contrast. Multiplanar CT image reconstructions and MIPs were obtained to evaluate the vascular anatomy. CONTRAST:  OMNIPAQUE IOHEXOL 350 MG/ML SOLN COMPARISON:  None. FINDINGS: Cardiovascular: Satisfactory opacification of the pulmonary arteries to the segmental level. No evidence of pulmonary embolism. Normal heart size. No pericardial effusion. Mediastinum/Nodes: No enlarged mediastinal, hilar, or axillary lymph nodes. Thyroid gland, trachea, and esophagus demonstrate no significant findings. Lungs/Pleura: Moderate to marked severity bilateral multifocal infiltrates are seen. A large area of infiltrate is seen within  the perihilar regions of the left upper lobe and left lower lobe. There is a very small left pleural effusion. No pneumothorax is identified. Upper Abdomen: No acute abnormality. Musculoskeletal: No chest wall abnormality. No acute or significant osseous findings. Review of the MIP images confirms the above findings. IMPRESSION: 1. Moderate to marked severity bilateral multifocal infiltrates with a large area of infiltrate within the perihilar regions of the left upper lobe and left lower lobe. Follow-up to resolution is recommended to exclude the presence of an underlying neoplastic process. 2. Very small left pleural effusion. Electronically Signed   By: Aram Candela M.D.   On: 04/09/2020 19:42   DG Chest Portable 1 View  Result Date: 04/09/2020 CLINICAL DATA:  Cough and fever since yesterday EXAM: PORTABLE CHEST 1 VIEW COMPARISON:  03/26/2020 FINDINGS: Single frontal view of the chest demonstrates dense left basilar consolidation consistent with pneumonia. No large effusion or pneumothorax. No acute bony abnormalities. IMPRESSION: 1. Dense left basilar consolidation consistent with pneumonia. Electronically Signed   By: Sharlet Salina M.D.   On: 04/09/2020 17:13    Procedures Procedures   CRITICAL CARE Performed by: Karrie Meres   Total critical care time: 37 minutes  Critical care time was exclusive of separately billable procedures and treating other patients.  Critical care was necessary to treat or prevent imminent or life-threatening deterioration.  Critical care was time spent personally by me on the following activities: development of treatment plan with patient and/or surrogate as well as nursing, discussions with consultants, evaluation of patient's response to treatment, examination of patient, obtaining history from patient or surrogate, ordering and performing treatments and interventions, ordering and review of laboratory studies, ordering and review of radiographic  studies, pulse oximetry and re-evaluation of patient's condition.   Medications Ordered in ED Medications  azithromycin (ZITHROMAX) 500 mg in sodium chloride 0.9 % 250 mL IVPB (500 mg Intravenous New Bag/Given 04/09/20 1921)  sodium chloride 0.9 % bolus 1,000 mL (has no administration in  time range)  acetaminophen (TYLENOL) tablet 650 mg (650 mg Oral Given 04/09/20 1727)  albuterol (VENTOLIN HFA) 108 (90 Base) MCG/ACT inhaler 6 puff (6 puffs Inhalation Given 04/09/20 1709)  methylPREDNISolone sodium succinate (SOLU-MEDROL) 125 mg/2 mL injection 125 mg (125 mg Intravenous Given 04/09/20 1726)  sodium chloride 0.9 % bolus 1,000 mL (0 mLs Intravenous Stopped 04/09/20 1942)  LORazepam (ATIVAN) injection 0.5 mg (0.5 mg Intravenous Given 04/09/20 1724)  ipratropium (ATROVENT HFA) inhaler 2 puff (2 puffs Inhalation Given 04/09/20 1711)  cefTRIAXone (ROCEPHIN) 1 g in sodium chloride 0.9 % 100 mL IVPB (0 g Intravenous Stopped 04/09/20 1942)  LORazepam (ATIVAN) injection 0.5 mg (0.5 mg Intravenous Given 04/09/20 1954)  iohexol (OMNIPAQUE) 350 MG/ML injection 100 mL (100 mLs Intravenous Contrast Given 04/09/20 1929)    ED Course  I have reviewed the triage vital signs and the nursing notes.  Pertinent labs & imaging results that were available during my care of the patient were reviewed by me and considered in my medical decision making (see chart for details).    MDM Rules/Calculators/A&P                          30 y/o F presenting for eval of cough and fever.   Reviewed/interpreted labs CBC - w/o leukocytosis or anemia CMP -with mild hyponatremia.  Marginally elevated AST, otherwise reassuring. Trop -marginally elevated, likely due to demand in setting of tachycardia COVID -negative UA - with hematuria, otherwise reassuring Urine preg - neg Lactic -elevated at 2.1 Blood cultures obtained  EKG - with sinus tach, left posteiror fascicular block, age undetermined anterior infarct, no change from  prior   Reviewed/interpreted labs  CXR - 1. Dense left basilar consolidation consistent with pneumonia. CTA chest with multifocal pna. No pe.   Pt with pneumonia with new oxygen requirement. covid neg. Likely with concurrent COPD exacerbation. Given albuterol/atrovent, steroids and abx. Will admit for further treatment.   7:46 PM CONSULT with Dr. Sheliah PlaneZierle-Gosh [facility]   Final Clinical Impression(s) / ED Diagnoses Final diagnoses:  Acute respiratory failure with hypoxia [facility](HCC)    Rx / DC Orders ED Discharge Orders    None       Karrie MeresCouture, Christifer Chapdelaine S, PA-C 04/09/20 [address]1957    Demarquis Osley, Avon Parkortni S, PA-C 04/09/20 1956    Rolan BuccoBelfi, Melanie, MD 04/09/20 2303

## 2020-04-09 NOTE — Progress Notes (Signed)
Patient accepted to [facility] tele bed. [facility] hospitalists to assume care upon arrival to accepting facility.

## 2020-04-09 NOTE — ED Notes (Signed)
Assumed care of this patient. Vitals taken. NAD. Respirations regular/unlabored. Patient able to speak full sentences. Connected to cardiac monitor, BP, pulse ox. Stretcher low, wheels locked, call bell within reach. Will continue to monitor.

## 2020-04-09 NOTE — ED Triage Notes (Signed)
Cough fever since yesterday. Chest pain. She feels like she has pneumonia.

## 2020-04-10 ENCOUNTER — Encounter (HOSPITAL_COMMUNITY): Payer: Self-pay | Admitting: Family Medicine

## 2020-04-10 DIAGNOSIS — J189 Pneumonia, unspecified organism: Secondary | ICD-10-CM | POA: Diagnosis present

## 2020-04-10 DIAGNOSIS — R6520 Severe sepsis without septic shock: Secondary | ICD-10-CM | POA: Diagnosis not present

## 2020-04-10 DIAGNOSIS — Z79899 Other long term (current) drug therapy: Secondary | ICD-10-CM | POA: Diagnosis not present

## 2020-04-10 DIAGNOSIS — Z720 Tobacco use: Secondary | ICD-10-CM

## 2020-04-10 DIAGNOSIS — R079 Chest pain, unspecified: Secondary | ICD-10-CM

## 2020-04-10 DIAGNOSIS — F1210 Cannabis abuse, uncomplicated: Secondary | ICD-10-CM | POA: Diagnosis not present

## 2020-04-10 DIAGNOSIS — F1510 Other stimulant abuse, uncomplicated: Secondary | ICD-10-CM | POA: Diagnosis not present

## 2020-04-10 DIAGNOSIS — J9601 Acute respiratory failure with hypoxia: Secondary | ICD-10-CM | POA: Diagnosis not present

## 2020-04-10 DIAGNOSIS — F1310 Sedative, hypnotic or anxiolytic abuse, uncomplicated: Secondary | ICD-10-CM | POA: Diagnosis not present

## 2020-04-10 DIAGNOSIS — F332 Major depressive disorder, recurrent severe without psychotic features: Secondary | ICD-10-CM

## 2020-04-10 DIAGNOSIS — F419 Anxiety disorder, unspecified: Secondary | ICD-10-CM | POA: Diagnosis not present

## 2020-04-10 DIAGNOSIS — A419 Sepsis, unspecified organism: Secondary | ICD-10-CM

## 2020-04-10 DIAGNOSIS — I10 Essential (primary) hypertension: Secondary | ICD-10-CM | POA: Diagnosis not present

## 2020-04-10 DIAGNOSIS — F17210 Nicotine dependence, cigarettes, uncomplicated: Secondary | ICD-10-CM | POA: Diagnosis not present

## 2020-04-10 DIAGNOSIS — E872 Acidosis: Secondary | ICD-10-CM | POA: Diagnosis not present

## 2020-04-10 DIAGNOSIS — J439 Emphysema, unspecified: Secondary | ICD-10-CM | POA: Diagnosis not present

## 2020-04-10 DIAGNOSIS — Z20822 Contact with and (suspected) exposure to covid-19: Secondary | ICD-10-CM | POA: Diagnosis not present

## 2020-04-10 LAB — RAPID URINE DRUG SCREEN, HOSP PERFORMED
Amphetamines: POSITIVE — AB
Barbiturates: NOT DETECTED
Benzodiazepines: POSITIVE — AB
Cocaine: NOT DETECTED
Opiates: NOT DETECTED
Tetrahydrocannabinol: POSITIVE — AB

## 2020-04-10 LAB — C-REACTIVE PROTEIN: CRP: 24 mg/dL — ABNORMAL HIGH (ref <1.0)

## 2020-04-10 LAB — PROCALCITONIN: Procalcitonin: 24.24 ng/mL

## 2020-04-10 MED ORDER — BUPRENORPHINE HCL-NALOXONE HCL 8-2 MG SL SUBL
1.0000 | SUBLINGUAL_TABLET | Freq: Three times a day (TID) | SUBLINGUAL | Status: DC
  Administered 2020-04-10: 1 via SUBLINGUAL
  Filled 2020-04-10: qty 1

## 2020-04-10 MED ORDER — QUETIAPINE FUMARATE 25 MG PO TABS
50.0000 mg | ORAL_TABLET | Freq: Every day | ORAL | Status: DC
  Administered 2020-04-10 – 2020-04-12 (×3): 50 mg via ORAL
  Filled 2020-04-10 (×3): qty 2

## 2020-04-10 MED ORDER — IPRATROPIUM-ALBUTEROL 0.5-2.5 (3) MG/3ML IN SOLN
3.0000 mL | Freq: Four times a day (QID) | RESPIRATORY_TRACT | Status: DC
  Administered 2020-04-10 – 2020-04-11 (×7): 3 mL via RESPIRATORY_TRACT
  Filled 2020-04-10 (×7): qty 3

## 2020-04-10 MED ORDER — NICOTINE 14 MG/24HR TD PT24
14.0000 mg | MEDICATED_PATCH | Freq: Every day | TRANSDERMAL | Status: DC
  Administered 2020-04-10 – 2020-04-13 (×4): 14 mg via TRANSDERMAL
  Filled 2020-04-10 (×5): qty 1

## 2020-04-10 MED ORDER — ENOXAPARIN SODIUM 40 MG/0.4ML ~~LOC~~ SOLN
40.0000 mg | SUBCUTANEOUS | Status: DC
  Filled 2020-04-10 (×2): qty 0.4

## 2020-04-10 MED ORDER — CLONAZEPAM 1 MG PO TABS
1.0000 mg | ORAL_TABLET | Freq: Three times a day (TID) | ORAL | Status: DC | PRN
Start: 2020-04-10 — End: 2020-04-10

## 2020-04-10 MED ORDER — CLONAZEPAM 1 MG PO TABS: 1.0000 mg | ORAL_TABLET | Freq: Three times a day (TID) | ORAL | Status: DC | PRN

## 2020-04-10 MED ORDER — ALBUTEROL SULFATE (2.5 MG/3ML) 0.083% IN NEBU: 2.5000 mg | INHALATION_SOLUTION | RESPIRATORY_TRACT | Status: DC | PRN

## 2020-04-10 MED ORDER — ACETAMINOPHEN 325 MG PO TABS
650.0000 mg | ORAL_TABLET | Freq: Four times a day (QID) | ORAL | Status: DC | PRN
  Administered 2020-04-10: 650 mg via ORAL
  Filled 2020-04-10: qty 2

## 2020-04-10 MED ORDER — GABAPENTIN 400 MG PO CAPS
800.0000 mg | ORAL_CAPSULE | Freq: Four times a day (QID) | ORAL | Status: DC
  Administered 2020-04-10 – 2020-04-13 (×13): 800 mg via ORAL
  Filled 2020-04-10 (×13): qty 2

## 2020-04-10 MED ORDER — ACETAMINOPHEN 650 MG RE SUPP: 650.0000 mg | Freq: Four times a day (QID) | RECTAL | Status: DC | PRN

## 2020-04-10 MED ORDER — GUAIFENESIN ER 600 MG PO TB12
600.0000 mg | ORAL_TABLET | Freq: Two times a day (BID) | ORAL | Status: DC
  Administered 2020-04-10 – 2020-04-13 (×7): 600 mg via ORAL
  Filled 2020-04-10 (×7): qty 1

## 2020-04-10 MED ORDER — SODIUM CHLORIDE 0.9 % IV SOLN
INTRAVENOUS | Status: AC
  Administered 2020-04-10: 150 mL/h via INTRAVENOUS

## 2020-04-10 MED ORDER — SODIUM CHLORIDE 0.9 % IV SOLN
500.0000 mg | INTRAVENOUS | Status: DC
  Administered 2020-04-10: 500 mg via INTRAVENOUS
  Filled 2020-04-10: qty 500

## 2020-04-10 MED ORDER — SERTRALINE HCL 50 MG PO TABS
50.0000 mg | ORAL_TABLET | Freq: Every morning | ORAL | Status: DC
  Administered 2020-04-10 – 2020-04-13 (×4): 50 mg via ORAL
  Filled 2020-04-10 (×4): qty 1

## 2020-04-10 MED ORDER — HALOPERIDOL 5 MG PO TABS
10.0000 mg | ORAL_TABLET | Freq: Two times a day (BID) | ORAL | Status: DC
  Administered 2020-04-10 (×2): 10 mg via ORAL
  Filled 2020-04-10 (×3): qty 2

## 2020-04-10 MED ORDER — CLONAZEPAM 1 MG PO TABS
2.0000 mg | ORAL_TABLET | Freq: Three times a day (TID) | ORAL | Status: DC | PRN
Start: 2020-04-10 — End: 2020-04-10
  Administered 2020-04-10: 2 mg via ORAL
  Filled 2020-04-10: qty 2

## 2020-04-10 MED ORDER — METHYLPREDNISOLONE SODIUM SUCC 125 MG IJ SOLR
60.0000 mg | Freq: Four times a day (QID) | INTRAMUSCULAR | Status: DC
  Administered 2020-04-10 – 2020-04-12 (×8): 60 mg via INTRAVENOUS
  Filled 2020-04-10 (×9): qty 2

## 2020-04-10 MED ORDER — CLONAZEPAM 1 MG PO TABS
2.0000 mg | ORAL_TABLET | Freq: Three times a day (TID) | ORAL | Status: DC | PRN
  Administered 2020-04-10 – 2020-04-13 (×8): 2 mg via ORAL
  Filled 2020-04-10 (×8): qty 2

## 2020-04-10 MED ORDER — SERTRALINE HCL 100 MG PO TABS
100.0000 mg | ORAL_TABLET | Freq: Every morning | ORAL | Status: DC
  Administered 2020-04-10 – 2020-04-13 (×4): 100 mg via ORAL
  Filled 2020-04-10 (×4): qty 1

## 2020-04-10 MED ORDER — SODIUM CHLORIDE 0.9 % IV SOLN
1.0000 g | INTRAVENOUS | Status: DC
  Administered 2020-04-10 – 2020-04-12 (×3): 1 g via INTRAVENOUS
  Filled 2020-04-10: qty 10
  Filled 2020-04-10 (×2): qty 1
  Filled 2020-04-10: qty 0.5

## 2020-04-10 NOTE — Progress Notes (Signed)
PROGRESS NOTE    Patient: Lindsay Miranda                            PCP: Patient, No Pcp Per                    DOB: 08-Jul-1990            DOA: 04/09/2020 ERX:[id number]             DOS: 04/10/2020, 11:09 AM   LOS: 0 days   Date of Service: The patient was seen and examined on 04/10/2020  Subjective:   The patient was seen and examined this morning. Stable at this time. Still complaining of bladder discomfort Otherwise no issues overnight .  Brief Narrative:    Lindsay Miranda is a 30 y.o. female with medical history significant of heroin abuse, bipolar disorder, MDD, anxiety chronically on benzodiazepines, hypertension, recent hospitalization 2/4-2/6 from benzodiazepine withdrawal presented to the ED with complaints of fever, cough, chest pain, and shortness of breath.   She is not vaccinated against COVID. Reports history of COPD and emphysema. Denies current opiate use. States Subutex was tapered off during her recent hospitalization but when she went to her PCP, he restarted it with plan to taper it off over a longer period of time. Also states her PCP increased the dose of her Klonopin as she was in the hospital for withdrawal.  ED Course: Febrile with temperature 100.4 F.  Tachycardic and tachypneic.  Blood pressure low with systolic in the 95K.  Satting 90-92% on room air, placed on 2 L supplemental oxygen.  WBC 7.6 EKG without acute ischemic changes.  Lactic acid 2.1 >2.1.   SARS-CoV-2 PCR test negative.    UA not suggestive of infection.  Chest x-ray showing dense left basilar consolidation.   CT angiogram chest showing moderate to marked severely bilateral multifocal infiltrates with a large area of infiltrate within the perihilar regions of the left upper lobe and left lower lobe.    Assessment & Plan:   Principal Problem:   Pneumonia Active Problems:   MDD (major depressive disorder), recurrent severe, without psychosis ([place])   Severe sepsis (HCC)   Chest  pain   Tobacco use   Severe sepsis secondary to CAP:  The patient met the criteria on admission (fever, tachycardia, tachypnea, lactic acidosis,  hypotension )--- symptoms has improved CT angiogram chest showing moderate to marked severely bilateral multifocal infiltrates with a large area of infiltrate within the perihilar regions of the left upper lobe and left lower lobe.  SARS-CoV-2 PCR test negative.  Influenza panel negative.   -Currently hemodynamically stable, improved lactic acid, no leukocytosis -Per protocol status post IV fluid resuscitation, -We will continue current antibiotics of Rocephin azithromycin -We will follow with blood cultures -We will try to obtain sputum cultures   Acute COPD exacerbation:  -Improved wheezing, shortness of breath -She has been started on IV Solu-Medrol 60 every 6 we will continue for today -Continue pulmonary toiletry -We will continue DuoNeb bronchodilator treatment, mucolytic's    Acute hypoxemic respiratory failure:  -Likely due to pneumonia, COPD exacerbation -On admission was satting in 90s on room air, improved with supplemental oxygen -Maintain O2 sat greater than 92% currently on 2 L --wean down as tolerated    Chest pain:  -Denies any chest pain this morning High-sensitivity troponin mildly elevated but stable (25 >27).  EKG without acute ischemic changes.  CTA chest negative for PE.  -Cardiac monitoring.   -Urine drug screen positive for amphetamine, benzodiazepine, and marijuana  History of heroin abuse -Subutex not on formulary, discussed with pharmacy and switched to equivalent dose of Suboxone -Once again urine drug screen is positive amphetamine, benzodiazepine, and marijuana  Bipolar disorder, MDD -Continue home meds  Anxiety:  Chronically on benzodiazepines and was recently admitted for withdrawal after Klonopin was discontinued abruptly. -Continue home Klonopin to be continued with tapering to be continued  for now with slow tapering  Tobacco use -NicoDerm patch and counseling  Hypertension: Not on any antihypertensives at home. -Hold antihypertensives given concern for severe sepsis and mild hypotension  DVT prophylaxis: Lovenox Code Status: Full code Family Communication: No family available at this time. Disposition Plan: Status is: Observation  The patient remains OBS appropriate and will d/c before 2 midnights.  Dispo: The patient is from: Home  Anticipated d/c is to: Home  Anticipated d/c date is: 2-3 days  Patient currently is not medically stable to d/c.              Difficult to place patient No  Level of care: Level of care: Telemetry   ------------------------------------------------------------------------------------------------------------------------------------ Nutritional status:  The patient's BMI is: Body mass index is 27.46 kg/m. I agree with the assessment and plan as outlined--   ------------------------------------------------------------------------------------------------------------------------------------ Cultures; Blood Cultures x 2 >>   Sputum Culture >>    Antimicrobials:   Level of care: Telemetry   Procedures:   No admission procedures for hospital encounter.    Antimicrobials:  Anti-infectives (From admission, onward)   Start     Dose/Rate Route Frequency Ordered Stop   04/10/20 1800  cefTRIAXone (ROCEPHIN) 1 g in sodium chloride 0.9 % 100 mL IVPB        1 g 200 mL/hr over 30 Minutes Intravenous Every 24 hours 04/10/20 0455     04/10/20 1800  azithromycin (ZITHROMAX) 500 mg in sodium chloride 0.9 % 250 mL IVPB        500 mg 250 mL/hr over 60 Minutes Intravenous Every 24 hours 04/10/20 0455     04/09/20 1815  cefTRIAXone (ROCEPHIN) 1 g in sodium chloride 0.9 % 100 mL IVPB        1 g 200 mL/hr over 30 Minutes Intravenous  Once 04/09/20 1805 04/09/20 1942   04/09/20 1815  azithromycin  (ZITHROMAX) 500 mg in sodium chloride 0.9 % 250 mL IVPB        500 mg 250 mL/hr over 60 Minutes Intravenous  Once 04/09/20 1805 04/09/20 2041       Medication:  . buprenorphine-naloxone  1 tablet Sublingual TID  . enoxaparin (LOVENOX) injection  40 mg Subcutaneous Q24H  . gabapentin  800 mg Oral QID  . guaiFENesin  600 mg Oral BID  . haloperidol  10 mg Oral BID  . ipratropium-albuterol  3 mL Nebulization Q6H  . methylPREDNISolone (SOLU-MEDROL) injection  60 mg Intravenous Q6H  . nicotine  14 mg Transdermal Daily  . QUEtiapine  50 mg Oral QHS  . sertraline  100 mg Oral q AM  . sertraline  50 mg Oral q AM    acetaminophen **OR** acetaminophen, albuterol, clonazePAM   Objective:   Vitals:   04/10/20 0310 04/10/20 0726 04/10/20 0838 04/10/20 0841  BP:  (!) 90/57 107/76   Pulse:  90    Resp:  20    Temp:  97.9 F (36.6 C)    TempSrc:      SpO2:  95%  94%  Weight:      Height: '5\' 5"'  (1.651 m)       Intake/Output Summary (Last 24 hours) at 04/10/2020 1109 Last data filed at 04/10/2020 1013 Gross per 24 hour  Intake 2834.79 ml  Output 400 ml  Net 2434.79 ml   Filed Weights   04/09/20 1617  Weight: 74.8 kg     Examination:   Physical Exam  Constitution:  Alert, cooperative, no distress,  Appears calm and comfortable  Psychiatric: Normal and stable mood and affect, cognition intact,   HEENT: Normocephalic, PERRL, otherwise with in Normal limits  Chest:Chest symmetric Cardio vascular:  S1/S2, RRR, No murmure, No Rubs or Gallops  pulmonary: Clear to auscultation bilaterally, respirations unlabored, negative wheezes / crackles Abdomen: Soft, non-tender, non-distended, bowel sounds,no masses, no organomegaly Muscular skeletal: Limited exam - in bed, able to move all 4 extremities, Normal strength,  Neuro: CNII-XII intact. , normal motor and sensation, reflexes intact  Extremities: No pitting edema lower extremities, +2 pulses  Skin: Dry, warm to touch, negative for  any Rashes, No open wounds Wounds: per nursing documentation    ------------------------------------------------------------------------------------------------------------------------------------------    LABs:  CBC Latest Ref Rng & Units 04/09/2020 03/27/2020 03/26/2020  WBC 4.0 - 10.5 K/uL 7.6 9.9 14.4(H)  Hemoglobin 12.0 - 15.0 g/dL 13.9 14.9 16.3(H)  Hematocrit 36.0 - 46.0 % 39.0 44.4 47.4(H)  Platelets 150 - 400 K/uL 254 324 346   CMP Latest Ref Rng & Units 04/09/2020 03/27/2020 03/26/2020  Glucose 70 - 99 mg/dL 113(H) 129(H) 118(H)  BUN 6 - 20 mg/dL '12 13 11  ' Creatinine 0.44 - 1.00 mg/dL 0.90 0.57 0.66  Sodium 135 - 145 mmol/L 133(L) 136 133(L)  Potassium 3.5 - 5.1 mmol/L 4.1 4.7 3.8  Chloride 98 - 111 mmol/L 102 105 103  CO2 22 - 32 mmol/L 22 24 21(L)  Calcium 8.9 - 10.3 mg/dL 8.3(L) 8.9 9.1  Total Protein 6.5 - 8.1 g/dL 6.1(L) 6.8 7.2  Total Bilirubin 0.3 - 1.2 mg/dL 0.8 0.7 0.8  Alkaline Phos 38 - 126 U/L 55 49 54  AST 15 - 41 U/L 69(H) 13(L) 20  ALT 0 - 44 U/L 36 27 33       Micro Results Recent Results (from the past 240 hour(s))  Resp Panel by RT-PCR (Flu A&B, Covid) Nasopharyngeal Swab     Status: None   Collection Time: 04/09/20  4:51 PM   Specimen: Nasopharyngeal Swab; Nasopharyngeal(NP) swabs in vial transport medium  Result Value Ref Range Status   SARS Coronavirus 2 by RT PCR NEGATIVE NEGATIVE Final    Comment: (NOTE) SARS-CoV-2 target nucleic acids are NOT DETECTED.  The SARS-CoV-2 RNA is generally detectable in upper respiratory specimens during the acute phase of infection. The lowest concentration of SARS-CoV-2 viral copies this assay can detect is 138 copies/mL. A negative result does not preclude SARS-Cov-2 infection and should not be used as the sole basis for treatment or other patient management decisions. A negative result may occur with  improper specimen collection/handling, submission of specimen other than nasopharyngeal swab, presence of viral  mutation(s) within the areas targeted by this assay, and inadequate number of viral copies(<138 copies/mL). A negative result must be combined with clinical observations, patient history, and epidemiological information. The expected result is Negative.  Fact Sheet for Patients:  EntrepreneurPulse.com.au  Fact Sheet for Healthcare Providers:  IncredibleEmployment.be  This test is no t yet approved or cleared by the [place] FDA and  has been  authorized for detection and/or diagnosis of SARS-CoV-2 by FDA under an Emergency Use Authorization (EUA). This EUA will remain  in effect (meaning this test can be used) for the duration of the COVID-19 declaration under Section 564(b)(1) of the Act, 21 U.S.C.section 360bbb-3(b)(1), unless the authorization is terminated  or revoked sooner.       Influenza A by PCR NEGATIVE NEGATIVE Final   Influenza B by PCR NEGATIVE NEGATIVE Final    Comment: (NOTE) The Xpert Xpress SARS-CoV-2/FLU/RSV plus assay is intended as an aid in the diagnosis of influenza from Nasopharyngeal swab specimens and should not be used as a sole basis for treatment. Nasal washings and aspirates are unacceptable for Xpert Xpress SARS-CoV-2/FLU/RSV testing.  Fact Sheet for Patients: EntrepreneurPulse.com.au  Fact Sheet for Healthcare Providers: IncredibleEmployment.be  This test is not yet approved or cleared by the [place] FDA and has been authorized for detection and/or diagnosis of SARS-CoV-2 by FDA under an Emergency Use Authorization (EUA). This EUA will remain in effect (meaning this test can be used) for the duration of the COVID-19 declaration under Section 564(b)(1) of the Act, 21 U.S.C. section 360bbb-3(b)(1), unless the authorization is terminated or revoked.  Performed at [facility], [address] [place], [place] [address]   Blood culture (routine x 2)      Status: None (Preliminary result)   Collection Time: 04/09/20  4:55 PM   Specimen: BLOOD  Result Value Ref Range Status   Specimen Description   Final    BLOOD LEFT ANTECUBITAL Performed at [facility], [address] [place], [place] [address]    Special Requests   Final    BOTTLES DRAWN AEROBIC AND ANAEROBIC Blood Culture adequate volume Performed at [facility], [address] [place], [place] [address]    Culture   Final    NO GROWTH < 12 HOURS Performed at [facility], [place] [address], [place], [place] [address]    Report Status PENDING  Incomplete  Blood culture (routine x 2)     Status: None (Preliminary result)   Collection Time: 04/09/20  5:14 PM   Specimen: BLOOD  Result Value Ref Range Status   Specimen Description   Final    BLOOD BLOOD RIGHT HAND Performed at [facility], [address] [place], [place] [address]    Special Requests   Final    BOTTLES DRAWN AEROBIC AND ANAEROBIC Blood Culture adequate volume Performed at [facility], [address] [place], [place] [address]    Culture   Final    NO GROWTH < 12 HOURS Performed at [facility], [place] [address] [place],  [address]    Report Status PENDING  Incomplete    Radiology Reports CT Angio Chest PE W and/or Wo Contrast  Result Date: 04/09/2020 CLINICAL DATA:  Cough, fever and chest pain. EXAM: CT ANGIOGRAPHY CHEST WITH CONTRAST TECHNIQUE: Multidetector CT imaging of the chest was performed using the standard protocol during bolus administration of intravenous contrast. Multiplanar CT image reconstructions and MIPs were obtained to evaluate the vascular anatomy. CONTRAST:  133m OMNIPAQUE IOHEXOL 350 MG/ML SOLN COMPARISON:  None. FINDINGS: Cardiovascular: Satisfactory opacification of the pulmonary arteries to the segmental level. No evidence of pulmonary embolism. Normal heart size. No pericardial effusion. Mediastinum/Nodes: No enlarged  mediastinal, hilar, or axillary lymph nodes. Thyroid gland, trachea, and esophagus demonstrate no significant findings. Lungs/Pleura: Moderate to marked severity  bilateral multifocal infiltrates are seen. A large area of infiltrate is seen within the perihilar regions of the left upper lobe and left lower lobe. There is a very small left pleural effusion. No pneumothorax is identified. Upper Abdomen: No acute abnormality. Musculoskeletal: No chest wall abnormality. No acute or significant osseous findings. Review of the MIP images confirms the above findings. IMPRESSION: 1. Moderate to marked severity bilateral multifocal infiltrates with a large area of infiltrate within the perihilar regions of the left upper lobe and left lower lobe. Follow-up to resolution is recommended to exclude the presence of an underlying neoplastic process. 2. Very small left pleural effusion. Electronically Signed   By: Virgina Norfolk M.D.   On: 04/09/2020 19:42   DG Chest Portable 1 View  Result Date: 04/09/2020 CLINICAL DATA:  Cough and fever since yesterday EXAM: PORTABLE CHEST 1 VIEW COMPARISON:  03/26/2020 FINDINGS: Single frontal view of the chest demonstrates dense left basilar consolidation consistent with pneumonia. No large effusion or pneumothorax. No acute bony abnormalities. IMPRESSION: 1. Dense left basilar consolidation consistent with pneumonia. Electronically Signed   By: Randa Ngo M.D.   On: 04/09/2020 17:13   DG Chest Portable 1 View  Result Date: 03/26/2020 CLINICAL DATA:  Drug withdrawal EXAM: PORTABLE CHEST 1 VIEW COMPARISON:  None. FINDINGS: The cardiomediastinal silhouette is normal in contour. No pleural effusion. No pneumothorax. No acute pleuroparenchymal abnormality. Visualized abdomen is unremarkable. No acute osseous abnormality noted. Linear structures overlying the upper shoulders likely external to patient. IMPRESSION: No acute cardiopulmonary abnormality. Electronically Signed   By:  Valentino Saxon MD   On: 03/26/2020 11:58    SIGNED: Deatra James, MD, FHM. [facility],  Pager (please use [email or web address] to page/text) Please use Epic Secure Chat for non-urgent communication (7AM-7PM)  If 7PM-7AM, please contact night-coverage [email or web address], 04/10/2020, 11:09 AM

## 2020-04-10 NOTE — H&P (Signed)
History and Physical    Lindsay Miranda MPN:[id number] DOB: 06-26-1990 DOA: 04/09/2020  PCP: Patient, No Pcp Per Patient coming from: [facility]  Chief Complaint: Fever, cough, chest pain, shortness of breath  HPI: Lindsay Miranda is a 30 y.o. female with medical history significant of heroin abuse, bipolar disorder, MDD, anxiety chronically on benzodiazepines, hypertension, recent hospitalization 2/4-2/6 from benzodiazepine withdrawal presented to the ED with complaints of fever, cough, chest pain, and shortness of breath. Patient reports 2-day history of fevers, cough, wheezing, and shortness of breath. Cough is productive of brown-colored sputum. She experiences chest pain whenever she coughs. She is not vaccinated against COVID. Reports history of COPD and emphysema. Denies current opiate use. States Subutex was tapered off during her recent hospitalization but when she went to her PCP, he restarted it with plan to taper it off over a longer period of time. Also states her PCP increased the dose of her Klonopin as she was in the hospital for withdrawal.  ED Course: Febrile with temperature 100.4 F.  Tachycardic and tachypneic.  Blood pressure low with systolic in the 90s.  Satting 90-92% on room air, placed on 2 L supplemental oxygen.  WBC 7.6, hemoglobin 13.9, platelet count 254K.  Sodium 133, potassium 4.1, chloride 102, bicarb 22, BUN 12, creatinine 0.9, glucose 113.  AST 69, remainder of LFTs are normal.  High-sensitivity troponin mildly elevated but stable (25 >27).  EKG without acute ischemic changes.  Lactic acid 2.1 >2.1.  SARS-CoV-2 PCR test negative.  Influenza panel negative.  Blood culture x2 pending.  Urine pregnancy test negative.  UA not suggestive of infection.  Chest x-ray showing dense left basilar consolidation.  CT angiogram chest showing moderate to marked severely bilateral multifocal infiltrates with a large area of infiltrate within the perihilar regions of  the left upper lobe and left lower lobe.  Follow-up to resolution is recommended to exclude the presence of an underlying neoplastic process.  Very small left pleural effusion. Patient was given Tylenol, albuterol-ipratropium inhaler treatment, Ativan, IV Solu-Medrol 125 mg, ceftriaxone, azithromycin, and 2 L normal saline boluses.  Review of Systems:  All systems reviewed and apart from history of presenting illness, are negative.  Past Medical History:  Diagnosis Date  . Anxiety   . Bipolar 1 disorder, depressed (HCC)   . Heroin abuse (HCC)   . Hypertension   . Renal disorder     Past Surgical History:  Procedure Laterality Date  . KIDNEY SURGERY Right 2016     reports that she has been smoking cigarettes. She has been smoking about 1.00 pack per day. She has never used smokeless tobacco. She reports previous alcohol use. She reports current drug use. Drugs: Benzodiazepines and Amphetamines.  Allergies  Allergen Reactions  . Risperdal [Risperidone] Other (See Comments)    Jaw locks.   . Suboxone [Buprenorphine Hcl-Naloxone Hcl] Nausea And Vomiting    Patient states she took suboxone for 2 months in the past and had one episode of vomiting during pregnancy.      History reviewed. No pertinent family history.  Prior to Admission medications   Medication Sig Start Date End Date Taking? Authorizing Provider  albuterol (VENTOLIN HFA) 108 (90 Base) MCG/ACT inhaler Inhale 2 puffs into the lungs every 4 (four) hours as needed for wheezing or shortness of breath.   Yes [provider]  amphetamine-dextroamphetamine (ADDERALL) 20 MG tablet Take 20 mg by mouth 2 (two) times daily.   Yes [provider]  buprenorphine (SUBUTEX) 8 MG SUBL SL tablet Place 8 mg under the tongue in the morning, at noon, and at bedtime.   Yes [provider]  clonazePAM (KLONOPIN) 1 MG tablet Take 1 tablet (1 mg total) by mouth 3 (three) times daily as needed for anxiety. Patient  taking differently: Take 2 mg by mouth 3 (three) times daily as needed for anxiety. 03/28/20  Yes Erick Blinks, MD  gabapentin (NEURONTIN) 800 MG tablet Take 1 tablet (800 mg total) by mouth 4 (four) times daily. 03/28/20  Yes Erick Blinks, MD  haloperidol (HALDOL) 10 MG tablet Take 1 tablet (10 mg total) by mouth 2 (two) times daily. 03/28/20  Yes Erick Blinks, MD  Multiple Vitamins-Minerals (AIRBORNE GUMMIES PO) Take 1 tablet by mouth daily.   Yes [provider]  Prenatal MV & Min w/FA-DHA (PRENATAL GUMMIES PO) Take 1 tablet by mouth daily.   Yes [provider]  QUEtiapine (SEROQUEL) 50 MG tablet Take 1 tablet (50 mg total) by mouth at bedtime. 03/28/20  Yes Erick Blinks, MD  sertraline (ZOLOFT) 100 MG tablet Take 1 tablet (100 mg total) by mouth in the morning. Take with 50mg  tablet every morning to equal 150mg  Patient taking differently: Take 100 mg by mouth in the morning. Takes w/ 50 to total 150mg  03/28/20  Yes , MD  sertraline (ZOLOFT) 50 MG tablet Take 1 tablet (50 mg total) by mouth in the morning. Take with 100mg  tablet every morning to equal 150mg  Patient taking differently: Take 50 mg by mouth in the morning. Take 50mg  w/ 100mg  to total 150mg  03/28/20  Yes 05/26/20, MD  benztropine (COGENTIN) 0.5 MG tablet Take 1 tablet (0.5 mg total) by mouth 2 (two) times daily. Patient not taking: No sig reported 03/28/20   , MD  cloNIDine (CATAPRES) 0.1 MG tablet Take 1 tab po bid for 2 days, then 1 tab po daily for 2 days then stop 03/28/20   , MD    Physical Exam: Vitals:   04/10/20 0118 04/10/20 0202 04/10/20 0304 04/10/20 0310  BP: (!) 93/57 100/70 98/69   Pulse: 94 96 91   Resp: 20 17 20    Temp: 98.7 F (37.1 C)  (!) 97.5 F (36.4 C)   TempSrc: Oral     SpO2: 95% 91% 96%   Weight:      Height:    5\' 5"  (1.651 m)    Physical Exam Constitutional:      General: She is not in acute distress. HENT:     Head:  Normocephalic and atraumatic.  Eyes:     Extraocular Movements: Extraocular movements intact.     Conjunctiva/sclera: Conjunctivae normal.  Cardiovascular:     Rate and Rhythm: Normal rate and regular rhythm.     Pulses: Normal pulses.  Pulmonary:     Effort: Pulmonary effort is normal.     Breath sounds: Wheezing present.     Comments: Diffuse end expiratory wheezing Abdominal:     General: Bowel sounds are normal. There is no distension.     Palpations: Abdomen is soft.     Tenderness: There is no abdominal tenderness.  Musculoskeletal:        General: No swelling or tenderness.     Cervical back: Normal range of motion and neck supple.  Skin:    General: Skin is warm and dry.  Neurological:     General: No focal deficit present.     Mental Status: She  is alert and oriented to person, place, and time.     Labs on Admission: I have personally reviewed following labs and imaging studies  CBC: Recent Labs  Lab 04/09/20 1651  WBC 7.6  NEUTROABS 5.7  HGB 13.9  HCT 39.0  MCV 91.5  PLT 254   Basic Metabolic Panel: Recent Labs  Lab 04/09/20 1651  NA 133*  K 4.1  CL 102  CO2 22  GLUCOSE 113*  BUN 12  CREATININE 0.90  CALCIUM 8.3*   GFR: Estimated Creatinine Clearance: 90.8 mL/min (by C-G formula based on SCr of 0.9 mg/dL). Liver Function Tests: Recent Labs  Lab 04/09/20 1651  AST 69*  ALT 36  ALKPHOS 55  BILITOT 0.8  PROT 6.1*  ALBUMIN 3.0*   No results for input(s): LIPASE, AMYLASE in the last 168 hours. No results for input(s): AMMONIA in the last 168 hours. Coagulation Profile: No results for input(s): INR, PROTIME in the last 168 hours. Cardiac Enzymes: No results for input(s): CKTOTAL, CKMB, CKMBINDEX, TROPONINI in the last 168 hours. BNP (last 3 results) No results for input(s): PROBNP in the last 8760 hours. HbA1C: No results for input(s): HGBA1C in the last 72 hours. CBG: No results for input(s): GLUCAP in the last 168 hours. Lipid  Profile: No results for input(s): CHOL, HDL, LDLCALC, TRIG, CHOLHDL, LDLDIRECT in the last 72 hours. Thyroid Function Tests: No results for input(s): TSH, T4TOTAL, FREET4, T3FREE, THYROIDAB in the last 72 hours. Anemia Panel: No results for input(s): VITAMINB12, FOLATE, FERRITIN, TIBC, IRON, RETICCTPCT in the last 72 hours. Urine analysis:    Component Value Date/Time   COLORURINE YELLOW 04/09/2020 1851   APPEARANCEUR HAZY (A) 04/09/2020 1851   LABSPEC 1.005 04/09/2020 1851   PHURINE 6.0 04/09/2020 1851   GLUCOSEU NEGATIVE 04/09/2020 1851   HGBUR TRACE (A) 04/09/2020 1851   BILIRUBINUR NEGATIVE 04/09/2020 1851   KETONESUR NEGATIVE 04/09/2020 1851   PROTEINUR NEGATIVE 04/09/2020 1851   NITRITE NEGATIVE 04/09/2020 1851   LEUKOCYTESUR NEGATIVE 04/09/2020 1851    Radiological Exams on Admission: CT Angio Chest PE W and/or Wo Contrast  Result Date: 04/09/2020 CLINICAL DATA:  Cough, fever and chest pain. EXAM: CT ANGIOGRAPHY CHEST WITH CONTRAST TECHNIQUE: Multidetector CT imaging of the chest was performed using the standard protocol during bolus administration of intravenous contrast. Multiplanar CT image reconstructions and MIPs were obtained to evaluate the vascular anatomy. CONTRAST:  OMNIPAQUE IOHEXOL 350 MG/ML SOLN COMPARISON:  None. FINDINGS: Cardiovascular: Satisfactory opacification of the pulmonary arteries to the segmental level. No evidence of pulmonary embolism. Normal heart size. No pericardial effusion. Mediastinum/Nodes: No enlarged mediastinal, hilar, or axillary lymph nodes. Thyroid gland, trachea, and esophagus demonstrate no significant findings. Lungs/Pleura: Moderate to marked severity bilateral multifocal infiltrates are seen. A large area of infiltrate is seen within the perihilar regions of the left upper lobe and left lower lobe. There is a very small left pleural effusion. No pneumothorax is identified. Upper Abdomen: No acute abnormality. Musculoskeletal: No chest  wall abnormality. No acute or significant osseous findings. Review of the MIP images confirms the above findings. IMPRESSION: 1. Moderate to marked severity bilateral multifocal infiltrates with a large area of infiltrate within the perihilar regions of the left upper lobe and left lower lobe. Follow-up to resolution is recommended to exclude the presence of an underlying neoplastic process. 2. Very small left pleural effusion. Electronically Signed   By: Aram Candela M.D.   On: 04/09/2020 19:42   DG Chest Portable 1 View  Result Date: 04/09/2020 CLINICAL DATA:  Cough and fever since yesterday EXAM: PORTABLE CHEST 1 VIEW COMPARISON:  03/26/2020 FINDINGS: Single frontal view of the chest demonstrates dense left basilar consolidation consistent with pneumonia. No large effusion or pneumothorax. No acute bony abnormalities. IMPRESSION: 1. Dense left basilar consolidation consistent with pneumonia. Electronically Signed   By: Sharlet SalinaMichael  Brown M.D.   On: 04/09/2020 17:13    EKG: Independently reviewed.  Sinus tachycardia, nonspecific T wave abnormality.  No significant change since prior tracing.  Assessment/Plan Principal Problem:   Pneumonia Active Problems:   MDD (major depressive disorder), recurrent severe, without psychosis (HCC)   Severe sepsis (HCC)   Chest pain   Tobacco use   Severe sepsis secondary to CAP: CT angiogram chest showing moderate to marked severely bilateral multifocal infiltrates with a large area of infiltrate within the perihilar regions of the left upper lobe and left lower lobe.  Follow-up to resolution is recommended to exclude the presence of an underlying neoplastic process.  Very small left pleural effusion.  SARS-CoV-2 PCR test negative.  Influenza panel negative.  No leukocytosis on labs.  Did meet criteria for severe sepsis at the time of presentation given fever, tachycardia, tachypnea, mild lactic acidosis, and slight hypotension.  -Received 30 cc/kg fluid  boluses per sepsis protocol, continue IV fluid hydration.  Continue ceftriaxone and azithromycin.  Blood culture x2 pending.  Acute COPD exacerbation: Wheezing on exam. -Order IV Solu-Medrol 60 mg every 6 hours, DuoNebs every 6 hours, albuterol nebulizer as needed, and Mucinex.   Acute hypoxemic respiratory failure: Likely multifactorial from pneumonia and COPD exacerbation. Sats in the low 90s on room air in the ED, currently satting well on 2 L supplemental oxygen.  -Supplemental oxygen, wean as tolerated.  Chest pain: High-sensitivity troponin mildly elevated but stable (25 >27).  EKG without acute ischemic changes.  CTA chest negative for PE. Currently chest pain-free and appears comfortable. -Cardiac monitoring.  Check UDS.  History of heroin abuse -Subutex not on formulary, discussed with pharmacy and switched to equivalent dose of Suboxone  Bipolar disorder, MDD -Continue home meds  Anxiety: Chronically on benzodiazepines and was recently admitted for withdrawal after Klonopin was discontinued abruptly. -Continue home Klonopin  Tobacco use -NicoDerm patch and counseling  Hypertension: Not on any antihypertensives at home. -Hold antihypertensives given concern for severe sepsis and mild hypotension  DVT prophylaxis: Lovenox Code Status: Full code Family Communication: No family available at this time. Disposition Plan: Status is: Observation  The patient remains OBS appropriate and will d/c before 2 midnights.  Dispo: The patient is from: Home              Anticipated d/c is to: Home              Anticipated d/c date is: 2 days              Patient currently is not medically stable to d/c.   Difficult to place patient No  Level of care: Level of care: Telemetry   The medical decision making on this patient was of high complexity and the patient is at high risk for clinical deterioration, therefore this is a level 3 visit.  John GiovanniVasundhra Dusan Lipford MD [facility]  If 7PM-7AM, please contact night-coverage [email or web address]  04/10/2020, 5:40 AM

## 2020-04-10 NOTE — Progress Notes (Signed)
Patient unable to void.  Bladder scanner showing 250cc's.  Pt very  uncomfortable and states she would like an in and out cath. MD made aware.  400cc's of clear yellow urine removed.  Pt states she feel relief.  Will continue to monitor.

## 2020-04-10 NOTE — Progress Notes (Signed)
Strong odor of cigarette smoke in BR.  Writer asked patient if she had been smoking and pt admitted to vaping.  RN explained to patient the hospital policy of no smoking on campus.  Patient stated she understood and would not vape again.  MD and charge nurse made aware.  Will continue to monitor closely.

## 2020-04-11 DIAGNOSIS — Z720 Tobacco use: Secondary | ICD-10-CM | POA: Diagnosis not present

## 2020-04-11 DIAGNOSIS — R079 Chest pain, unspecified: Secondary | ICD-10-CM | POA: Diagnosis not present

## 2020-04-11 DIAGNOSIS — F332 Major depressive disorder, recurrent severe without psychotic features: Secondary | ICD-10-CM | POA: Diagnosis not present

## 2020-04-11 DIAGNOSIS — J189 Pneumonia, unspecified organism: Secondary | ICD-10-CM | POA: Diagnosis present

## 2020-04-11 DIAGNOSIS — F1310 Sedative, hypnotic or anxiolytic abuse, uncomplicated: Secondary | ICD-10-CM | POA: Diagnosis present

## 2020-04-11 DIAGNOSIS — F419 Anxiety disorder, unspecified: Secondary | ICD-10-CM | POA: Diagnosis present

## 2020-04-11 DIAGNOSIS — F1210 Cannabis abuse, uncomplicated: Secondary | ICD-10-CM | POA: Diagnosis present

## 2020-04-11 DIAGNOSIS — J9601 Acute respiratory failure with hypoxia: Secondary | ICD-10-CM | POA: Diagnosis present

## 2020-04-11 DIAGNOSIS — E872 Acidosis: Secondary | ICD-10-CM | POA: Diagnosis present

## 2020-04-11 DIAGNOSIS — I10 Essential (primary) hypertension: Secondary | ICD-10-CM | POA: Diagnosis present

## 2020-04-11 DIAGNOSIS — J439 Emphysema, unspecified: Secondary | ICD-10-CM | POA: Diagnosis present

## 2020-04-11 DIAGNOSIS — R6520 Severe sepsis without septic shock: Secondary | ICD-10-CM | POA: Diagnosis present

## 2020-04-11 DIAGNOSIS — F17210 Nicotine dependence, cigarettes, uncomplicated: Secondary | ICD-10-CM | POA: Diagnosis present

## 2020-04-11 DIAGNOSIS — Z20822 Contact with and (suspected) exposure to covid-19: Secondary | ICD-10-CM | POA: Diagnosis present

## 2020-04-11 DIAGNOSIS — Z79899 Other long term (current) drug therapy: Secondary | ICD-10-CM | POA: Diagnosis not present

## 2020-04-11 DIAGNOSIS — A419 Sepsis, unspecified organism: Secondary | ICD-10-CM | POA: Diagnosis present

## 2020-04-11 DIAGNOSIS — F1510 Other stimulant abuse, uncomplicated: Secondary | ICD-10-CM | POA: Diagnosis present

## 2020-04-11 LAB — CBC
HCT: 32.2 % — ABNORMAL LOW (ref 36.0–46.0)
Hemoglobin: 11.2 g/dL — ABNORMAL LOW (ref 12.0–15.0)
MCH: 32.9 pg (ref 26.0–34.0)
MCHC: 34.8 g/dL (ref 30.0–36.0)
MCV: 94.7 fL (ref 80.0–100.0)
Platelets: 266 10*3/uL (ref 150–400)
RBC: 3.4 MIL/uL — ABNORMAL LOW (ref 3.87–5.11)
RDW: 13.4 % (ref 11.5–15.5)
WBC: 13.8 10*3/uL — ABNORMAL HIGH (ref 4.0–10.5)
nRBC: 0 % (ref 0.0–0.2)

## 2020-04-11 LAB — BASIC METABOLIC PANEL
Anion gap: 6 (ref 5–15)
BUN: 15 mg/dL (ref 6–20)
CO2: 24 mmol/L (ref 22–32)
Calcium: 8.6 mg/dL — ABNORMAL LOW (ref 8.9–10.3)
Chloride: 109 mmol/L (ref 98–111)
Creatinine, Ser: 0.59 mg/dL (ref 0.44–1.00)
GFR, Estimated: >60 mL/min (ref >60)
Glucose, Bld: 130 mg/dL — ABNORMAL HIGH (ref 70–99)
Potassium: 4.2 mmol/L (ref 3.5–5.1)
Sodium: 139 mmol/L (ref 135–145)

## 2020-04-11 LAB — LACTIC ACID, PLASMA: Lactic Acid, Venous: 1.5 mmol/L (ref 0.5–1.9)

## 2020-04-11 MED ORDER — HALOPERIDOL 5 MG PO TABS
10.0000 mg | ORAL_TABLET | Freq: Two times a day (BID) | ORAL | Status: DC | PRN
  Administered 2020-04-12: 10 mg via ORAL
  Filled 2020-04-11 (×3): qty 2

## 2020-04-11 MED ORDER — RISAQUAD PO CAPS
2.0000 | ORAL_CAPSULE | Freq: Three times a day (TID) | ORAL | Status: DC
  Administered 2020-04-11 – 2020-04-13 (×7): 2 via ORAL
  Filled 2020-04-11 (×7): qty 2

## 2020-04-11 MED ORDER — IPRATROPIUM-ALBUTEROL 0.5-2.5 (3) MG/3ML IN SOLN
3.0000 mL | Freq: Three times a day (TID) | RESPIRATORY_TRACT | Status: DC
  Filled 2020-04-11 (×3): qty 3

## 2020-04-11 MED ORDER — AZITHROMYCIN 250 MG PO TABS
500.0000 mg | ORAL_TABLET | Freq: Every day | ORAL | Status: DC
  Administered 2020-04-11 – 2020-04-12 (×2): 500 mg via ORAL
  Filled 2020-04-11 (×2): qty 2

## 2020-04-11 NOTE — Plan of Care (Signed)

## 2020-04-11 NOTE — Progress Notes (Signed)
Patient ambulated  180 feet. Activity tolerated well.  

## 2020-04-11 NOTE — Progress Notes (Signed)
PROGRESS NOTE    Patient: Lindsay Miranda                            PCP: Patient, No Pcp Per                    DOB: 1990-12-24            DOA: 04/09/2020 [id number]             DOS: 04/11/2020, 10:18 AM   LOS: 0 days   Date of Service: The patient was seen and examined on 04/11/2020  Subjective:  The patient was seen and examined this morning, remained stable, cooperative. Nursing staff present at bedside Satting 98% on room air   Brief Narrative:    Lindsay Miranda is a 30 y.o. female with medical history significant of heroin abuse, bipolar disorder, MDD, anxiety chronically on benzodiazepines, hypertension, recent hospitalization 2/4-2/6 from benzodiazepine withdrawal presented to the ED with complaints of fever, cough, chest pain, and shortness of breath.   She is not vaccinated against COVID. Reports history of COPD and emphysema. Denies current opiate use. States Subutex was tapered off during her recent hospitalization but when she went to her PCP, he restarted it with plan to taper it off over a longer period of time. Also states her PCP increased the dose of her Klonopin as she was in the hospital for withdrawal.  ED Course: Febrile with temperature 100.4 F.  Tachycardic and tachypneic.  Blood pressure low with systolic in the 76E.  Satting 90-92% on room air, placed on 2 L supplemental oxygen.  WBC 7.6 EKG without acute ischemic changes.  Lactic acid 2.1 >2.1.   SARS-CoV-2 PCR test negative.    UA not suggestive of infection.  Chest x-ray showing dense left basilar consolidation.   CT angiogram chest showing moderate to marked severely bilateral multifocal infiltrates with a large area of infiltrate within the perihilar regions of the left upper lobe and left lower lobe.    Assessment & Plan:   Principal Problem:   Pneumonia Active Problems:   MDD (major depressive disorder), recurrent severe, without psychosis ([place])   Severe sepsis (HCC)   Chest pain    Tobacco use   Severe sepsis secondary to CAP:  -Sepsis physiology much improved -Off supplemental oxygen satting 98% on room air  The patient met the criteria on admission (fever, tachycardia, tachypnea, lactic acidosis,  hypotension )--- much improved CT angiogram chest showing moderate to marked severely bilateral multifocal infiltrates with a large area of infiltrate within the perihilar regions of the left upper lobe and left lower lobe.  SARS-CoV-2 PCR test negative.  Influenza panel negative.   -Currently hemodynamically stable, improved lactic acid, no leukocytosis -Status post IV fluid resuscitation per protocol >> will discontinue -We will continue current antibiotics Rocephin azithromycin -Blood/sputum cultures >> following no reported growth yet    Acute COPD exacerbation:  -Continue improving rhonchi, wheezing -She has been started on IV Solu-Medrol 60 every 6 >> will taper down-Continue pulmonary toiletry -We will continue DuoNeb bronchodilator treatment, mucolytic's    Acute hypoxemic respiratory failure:  -Likely due to pneumonia, COPD exacerbation -Much improved currently satting 98% on room air -Maintain O2 sat greater than 92% currently on 2 L --weaned off successfully    Chest pain:  -Denies any chest pain this morning High-sensitivity troponin mildly elevated but stable (25 >27).  EKG without acute ischemic changes.  CTA chest negative for PE.  -Cardiac monitoring.   -Urine drug screen positive for amphetamine, benzodiazepine, and marijuana  History of heroin abuse/substance abuse -Subutex not on formulary, discussed with pharmacy and switched to equivalent dose of Suboxone -Once again urine drug screen is positive amphetamine, benzodiazepine, and marijuana -Discussed with patient in detail as father was present at bedside   Bipolar disorder, MDD -Continue home meds  Anxiety:  Chronically on benzodiazepines and was recently admitted for  withdrawal after Klonopin was discontinued abruptly. -Continue home Klonopin to be continued with tapering to be continued for now with slow tapering  Tobacco use -NicoDerm patch and counseling  ? H/o Hypertension: Not on any antihypertensives at home. -concern for severe sepsis and mild hypotension -Blood pressure remained stable 123/83 this morning     DVT prophylaxis: Lovenox Code Status: Full code Family Communication: No family available at this time. Disposition Plan: Status is: Observation  The patient remains OBS appropriate and will d/c before 2 midnights.  Dispo: The patient is from: Home  Anticipated d/c is to: Home  Anticipated d/c date is: 2-3 days  Patient currently is not medically stable to d/c.              Difficult to place patient No  Level of care: Level of care: Telemetry   ------------------------------------------------------------------------------------------------------------------------------------ Nutritional status:  The patient's BMI is: Body mass index is 27.46 kg/m. I agree with the assessment and plan as outlined--   ------------------------------------------------------------------------------------------------------------------------------------ Cultures; Blood Cultures x 2 >>   Sputum Culture >>    Antimicrobials:   Level of care: Telemetry   Procedures:   No admission procedures for hospital encounter.    Antimicrobials:  Anti-infectives (From admission, onward)   Start     Dose/Rate Route Frequency Ordered Stop   04/11/20 1000  azithromycin (ZITHROMAX) tablet 500 mg        500 mg Oral Daily 04/11/20 0845     04/10/20 1800  cefTRIAXone (ROCEPHIN) 1 g in sodium chloride 0.9 % 100 mL IVPB        1 g 200 mL/hr over 30 Minutes Intravenous Every 24 hours 04/10/20 0455     04/10/20 1800  azithromycin (ZITHROMAX) 500 mg in sodium chloride 0.9 % 250 mL IVPB  Status:  Discontinued         500 mg 250 mL/hr over 60 Minutes Intravenous Every 24 hours 04/10/20 0455 04/11/20 0845   04/09/20 1815  cefTRIAXone (ROCEPHIN) 1 g in sodium chloride 0.9 % 100 mL IVPB        1 g 200 mL/hr over 30 Minutes Intravenous  Once 04/09/20 1805 04/09/20 1942   04/09/20 1815  azithromycin (ZITHROMAX) 500 mg in sodium chloride 0.9 % 250 mL IVPB        500 mg 250 mL/hr over 60 Minutes Intravenous  Once 04/09/20 1805 04/09/20 2041       Medication:  . acidophilus  2 capsule Oral TID  . azithromycin  500 mg Oral Daily  . enoxaparin (LOVENOX) injection  40 mg Subcutaneous Q24H  . gabapentin  800 mg Oral QID  . guaiFENesin  600 mg Oral BID  . haloperidol  10 mg Oral BID  . ipratropium-albuterol  3 mL Nebulization Q6H  . methylPREDNISolone (SOLU-MEDROL) injection  60 mg Intravenous Q6H  . nicotine  14 mg Transdermal Daily  . QUEtiapine  50 mg Oral QHS  . sertraline  100 mg Oral q AM  . sertraline  50 mg Oral q AM  acetaminophen **OR** acetaminophen, albuterol, clonazePAM   Objective:   Vitals:   04/11/20 0553 04/11/20 0834 04/11/20 0910 04/11/20 0912  BP: 114/80 123/83    Pulse: 83 78    Resp: 18 18    Temp: 98.2 F (36.8 C) 98.1 F (36.7 C)    TempSrc: Oral Oral    SpO2: 93% 98% 98% 98%  Weight:      Height:        Intake/Output Summary (Last 24 hours) at 04/11/2020 1018 Last data filed at 04/11/2020 0958 Gross per 24 hour  Intake 1764 ml  Output 900 ml  Net 864 ml   Filed Weights   04/09/20 1617  Weight: 74.8 kg     Examination:     Physical Exam:   General:  Alert, oriented, cooperative, no distress;   HEENT:  Normocephalic, PERRL, otherwise with in Normal limits   Neuro:  CNII-XII intact. , normal motor and sensation, reflexes intact   Lungs:   Clear to auscultation BL, Respirations unlabored, no wheezes / crackles  Cardio:    S1/S2, RRR, No murmure, No Rubs or Gallops   Abdomen:   Soft, non-tender, bowel sounds active all four quadrants,  no guarding or  peritoneal signs.  Muscular skeletal:  Limited exam - in bed, able to move all 4 extremities, Normal strength,  2+ pulses,  symmetric, No pitting edema  Skin:  Dry, warm to touch, negative for any Rashes,  Wounds: Please see nursing documentation        -----------------------------------------------------------------------------------------------------------------------------------    LABs:  CBC Latest Ref Rng & Units 04/11/2020 04/09/2020 03/27/2020  WBC 4.0 - 10.5 K/uL 13.8(H) 7.6 9.9  Hemoglobin 12.0 - 15.0 g/dL 11.2(L) 13.9 14.9  Hematocrit 36.0 - 46.0 % 32.2(L) 39.0 44.4  Platelets 150 - 400 K/uL 266 254 324   CMP Latest Ref Rng & Units 04/11/2020 04/09/2020 03/27/2020  Glucose 70 - 99 mg/dL 130(H) 113(H) 129(H)  BUN 6 - 20 mg/dL '15 12 13  ' Creatinine 0.44 - 1.00 mg/dL 0.59 0.90 0.57  Sodium 135 - 145 mmol/L 139 133(L) 136  Potassium 3.5 - 5.1 mmol/L 4.2 4.1 4.7  Chloride 98 - 111 mmol/L 109 102 105  CO2 22 - 32 mmol/L '24 22 24  ' Calcium 8.9 - 10.3 mg/dL 8.6(L) 8.3(L) 8.9  Total Protein 6.5 - 8.1 g/dL - 6.1(L) 6.8  Total Bilirubin 0.3 - 1.2 mg/dL - 0.8 0.7  Alkaline Phos 38 - 126 U/L - 55 49  AST 15 - 41 U/L - 69(H) 13(L)  ALT 0 - 44 U/L - 36 27       Micro Results Recent Results (from the past 240 hour(s))  Resp Panel by RT-PCR (Flu A&B, Covid) Nasopharyngeal Swab     Status: None   Collection Time: 04/09/20  4:51 PM   Specimen: Nasopharyngeal Swab; Nasopharyngeal(NP) swabs in vial transport medium  Result Value Ref Range Status   SARS Coronavirus 2 by RT PCR NEGATIVE NEGATIVE Final    Comment: (NOTE) SARS-CoV-2 target nucleic acids are NOT DETECTED.  The SARS-CoV-2 RNA is generally detectable in upper respiratory specimens during the acute phase of infection. The lowest concentration of SARS-CoV-2 viral copies this assay can detect is 138 copies/mL. A negative result does not preclude SARS-Cov-2 infection and should not be used as the sole basis for treatment  or other patient management decisions. A negative result may occur with  improper specimen collection/handling, submission of specimen other than nasopharyngeal swab, presence of viral mutation(s) within  the areas targeted by this assay, and inadequate number of viral copies(<138 copies/mL). A negative result must be combined with clinical observations, patient history, and epidemiological information. The expected result is Negative.  Fact Sheet for Patients:  EntrepreneurPulse.com.au  Fact Sheet for Healthcare Providers:  IncredibleEmployment.be  This test is no t yet approved or cleared by the [place] FDA and  has been authorized for detection and/or diagnosis of SARS-CoV-2 by FDA under an Emergency Use Authorization (EUA). This EUA will remain  in effect (meaning this test can be used) for the duration of the COVID-19 declaration under Section 564(b)(1) of the Act, 21 U.S.C.section 360bbb-3(b)(1), unless the authorization is terminated  or revoked sooner.       Influenza A by PCR NEGATIVE NEGATIVE Final   Influenza B by PCR NEGATIVE NEGATIVE Final    Comment: (NOTE) The Xpert Xpress SARS-CoV-2/FLU/RSV plus assay is intended as an aid in the diagnosis of influenza from Nasopharyngeal swab specimens and should not be used as a sole basis for treatment. Nasal washings and aspirates are unacceptable for Xpert Xpress SARS-CoV-2/FLU/RSV testing.  Fact Sheet for Patients: EntrepreneurPulse.com.au  Fact Sheet for Healthcare Providers: IncredibleEmployment.be  This test is not yet approved or cleared by the [place] FDA and has been authorized for detection and/or diagnosis of SARS-CoV-2 by FDA under an Emergency Use Authorization (EUA). This EUA will remain in effect (meaning this test can be used) for the duration of the COVID-19 declaration under Section 564(b)(1) of the Act, 21 U.S.C. section  360bbb-3(b)(1), unless the authorization is terminated or revoked.  Performed at [facility], [address] [place], [place] [address]   Blood culture (routine x 2)     Status: None (Preliminary result)   Collection Time: 04/09/20  4:55 PM   Specimen: BLOOD  Result Value Ref Range Status   Specimen Description   Final    BLOOD LEFT ANTECUBITAL Performed at [facility], [address] [place], [place] [address]    Special Requests   Final    BOTTLES DRAWN AEROBIC AND ANAEROBIC Blood Culture adequate volume Performed at [facility], [address] [place], [place] [address]    Culture   Final    NO GROWTH < 12 HOURS Performed at [facility], [address], [place], [place] [address]    Report Status PENDING  Incomplete  Blood culture (routine x 2)     Status: None (Preliminary result)   Collection Time: 04/09/20  5:14 PM   Specimen: BLOOD  Result Value Ref Range Status   Specimen Description   Final    BLOOD BLOOD RIGHT HAND Performed at [facility], [address] [place], [place] [address]    Special Requests   Final    BOTTLES DRAWN AEROBIC AND ANAEROBIC Blood Culture adequate volume Performed at [facility], [address] [place], [place] [address]    Culture   Final    NO GROWTH < 12 HOURS Performed at [facility], [place], [place], [place] [address]    Report Status PENDING  Incomplete    Radiology Reports CT Angio Chest PE W and/or Wo Contrast  Result Date: 04/09/2020 CLINICAL DATA:  Cough, fever and chest pain. EXAM: CT ANGIOGRAPHY CHEST WITH CONTRAST TECHNIQUE: Multidetector CT imaging of the chest was performed using the standard protocol during bolus administration of intravenous contrast. Multiplanar CT image reconstructions and  MIPs were obtained to evaluate the vascular anatomy. CONTRAST:  132m OMNIPAQUE IOHEXOL 350 MG/ML SOLN COMPARISON:  None. FINDINGS: Cardiovascular:  Satisfactory opacification of the pulmonary arteries to the segmental level. No evidence of pulmonary embolism. Normal heart size. No pericardial effusion. Mediastinum/Nodes: No enlarged mediastinal, hilar, or axillary lymph nodes. Thyroid gland, trachea, and esophagus demonstrate no significant findings. Lungs/Pleura: Moderate to marked severity bilateral multifocal infiltrates are seen. A large area of infiltrate is seen within the perihilar regions of the left upper lobe and left lower lobe. There is a very small left pleural effusion. No pneumothorax is identified. Upper Abdomen: No acute abnormality. Musculoskeletal: No chest wall abnormality. No acute or significant osseous findings. Review of the MIP images confirms the above findings. IMPRESSION: 1. Moderate to marked severity bilateral multifocal infiltrates with a large area of infiltrate within the perihilar regions of the left upper lobe and left lower lobe. Follow-up to resolution is recommended to exclude the presence of an underlying neoplastic process. 2. Very small left pleural effusion. Electronically Signed   By: TVirgina NorfolkM.D.   On: 04/09/2020 19:42   DG Chest Portable 1 View  Result Date: 04/09/2020 CLINICAL DATA:  Cough and fever since yesterday EXAM: PORTABLE CHEST 1 VIEW COMPARISON:  03/26/2020 FINDINGS: Single frontal view of the chest demonstrates dense left basilar consolidation consistent with pneumonia. No large effusion or pneumothorax. No acute bony abnormalities. IMPRESSION: 1. Dense left basilar consolidation consistent with pneumonia. Electronically Signed   By: MRanda NgoM.D.   On: 04/09/2020 17:13   DG Chest Portable 1 View  Result Date: 03/26/2020 CLINICAL DATA:  Drug withdrawal EXAM: PORTABLE CHEST 1 VIEW COMPARISON:  None. FINDINGS: The cardiomediastinal silhouette is normal in contour. No pleural effusion. No pneumothorax. No acute pleuroparenchymal abnormality. Visualized abdomen is unremarkable. No acute  osseous abnormality noted. Linear structures overlying the upper shoulders likely external to patient. IMPRESSION: No acute cardiopulmonary abnormality. Electronically Signed   By: SValentino SaxonMD   On: 03/26/2020 11:58    SIGNED: SDeatra James MD, FHM. [facility],  Pager (please use [email or web address] to page/text) Please use Epic Secure Chat for non-urgent communication (7AM-7PM)  If 7PM-7AM, please contact night-coverage [email or web address], 04/11/2020, 10:18 AM

## 2020-04-11 NOTE — Progress Notes (Signed)
V.O received from hospitalist provider to discontinue telemetry monitor.

## 2020-04-12 LAB — BASIC METABOLIC PANEL
Anion gap: 7 (ref 5–15)
BUN: 17 mg/dL (ref 6–20)
CO2: 24 mmol/L (ref 22–32)
Calcium: 8.8 mg/dL — ABNORMAL LOW (ref 8.9–10.3)
Chloride: 108 mmol/L (ref 98–111)
Creatinine, Ser: 0.57 mg/dL (ref 0.44–1.00)
GFR, Estimated: >60 mL/min (ref >60)
Glucose, Bld: 119 mg/dL — ABNORMAL HIGH (ref 70–99)
Potassium: 4.2 mmol/L (ref 3.5–5.1)
Sodium: 139 mmol/L (ref 135–145)

## 2020-04-12 LAB — CBC
HCT: 33.9 % — ABNORMAL LOW (ref 36.0–46.0)
Hemoglobin: 11.6 g/dL — ABNORMAL LOW (ref 12.0–15.0)
MCH: 32.6 pg (ref 26.0–34.0)
MCHC: 34.2 g/dL (ref 30.0–36.0)
MCV: 95.2 fL (ref 80.0–100.0)
Platelets: 293 10*3/uL (ref 150–400)
RBC: 3.56 MIL/uL — ABNORMAL LOW (ref 3.87–5.11)
RDW: 13.4 % (ref 11.5–15.5)
WBC: 17.6 10*3/uL — ABNORMAL HIGH (ref 4.0–10.5)
nRBC: 0.1 % (ref 0.0–0.2)

## 2020-04-12 LAB — PATHOLOGIST SMEAR REVIEW

## 2020-04-12 MED ORDER — METHYLPREDNISOLONE SODIUM SUCC 125 MG IJ SOLR
60.0000 mg | Freq: Two times a day (BID) | INTRAMUSCULAR | Status: DC
  Administered 2020-04-12 – 2020-04-13 (×2): 60 mg via INTRAVENOUS
  Filled 2020-04-12 (×2): qty 2

## 2020-04-12 NOTE — Plan of Care (Signed)

## 2020-04-12 NOTE — Progress Notes (Signed)
Chaplain responded to spiritual consult.  Initiated a Dealer of presence.  Pt talked about her journey through bi-polar disorder, the bad things that had happened to her during her childhood, having been saved and baptized just three weeks ago.  Of how she now feels for the first time like her life will turn around.  Pt plans to use her experiences to help other women know they can survive and get healed too.  Chaplain prayed with pt at bedside.  Lindsay Miranda Chaplain

## 2020-04-12 NOTE — TOC Initial Note (Signed)
Transition of Care [facility]) - Initial/Assessment Note    Patient Details  Name: Lindsay Miranda MRN: [id number] Date of Birth: [birth year]  Transition of Care [facility] CM/SW Contact:    Lanier Clam, RN Phone Number: 04/12/2020, 5:29 PM  Clinical Narrative: d/c plan home.Continue to monitor.                  Expected Discharge Plan: Home/Self Care Barriers to Discharge: Continued Medical Work up   Patient Goals and CMS Choice Patient states their goals for this hospitalization and ongoing recovery are:: go home CMS Medicare.gov Compare Post Acute Care list provided to:: Patient    Expected Discharge Plan and Services Expected Discharge Plan: Home/Self Care   Discharge Planning Services: CM Consult   Living arrangements for the past 2 months: Single Family Home                                      Prior Living Arrangements/Services Living arrangements for the past 2 months: Single Family Home Lives with:: Parents Patient language and need for interpreter reviewed:: Yes Do you feel safe going back to the place where you live?: Yes      Need for Family Participation in Patient Care: No (Comment) Care giver support system in place?: Yes (comment)   Criminal Activity/Legal Involvement Pertinent to Current Situation/Hospitalization: No - Comment as needed  Activities of Daily Living Home Assistive Devices/Equipment: Eyeglasses,Dentures (specify type),Nebulizer ADL Screening (condition at time of admission) Patient's cognitive ability adequate to safely complete daily activities?: Yes Is the patient deaf or have difficulty hearing?: No Does the patient have difficulty seeing, even when wearing glasses/contacts?: No Does the patient have difficulty concentrating, remembering, or making decisions?: Yes Patient able to express need for assistance with ADLs?: Yes Does the patient have difficulty dressing or bathing?: No Independently performs ADLs?: Yes (appropriate for  developmental age) Does the patient have difficulty walking or climbing stairs?: No Weakness of Legs: None Weakness of Arms/Hands: None  Permission Sought/Granted Permission sought to share information with : Case Manager Permission granted to share information with : Yes, Verbal Permission Granted  Share Information with NAME: Case Manager           Emotional Assessment Appearance:: Appears stated age Attitude/Demeanor/Rapport: Gracious Affect (typically observed): Accepting Orientation: : Oriented to Self,Oriented to Place,Oriented to  Time Alcohol / Substance Use: Illicit Drugs Psych Involvement: Yes (comment)  Admission diagnosis:  Pneumonia [J18.9] Acute respiratory failure with hypoxia (HCC) [J96.01] Patient Active Problem List   Diagnosis Date Noted  . Severe sepsis (HCC) 04/10/2020  . Chest pain 04/10/2020  . Tobacco use 04/10/2020  . Pneumonia 04/09/2020  . Withdrawal from benzodiazepine (HCC) 03/26/2020  . Polysubstance dependence including opioid type drug with complication, episodic abuse, with unspecified complication (HCC)   . MDD (major depressive disorder), recurrent severe, without psychosis (HCC)   . 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 [place], [place] - [address] [place] [address] Phone: [phone] Fax: [phone]  [id number] - [place], [place] - [address] AT [facility] [address] [place] [address] Phone: [phone] Fax: [phone]  [facility] [address] [place] - [address] [place] [address] Phone:  [phone] Fax: [phone]     Social Determinants of Health (SDOH) Interventions    Readmission Risk Interventions Readmission Risk Prevention Plan 04/12/2020  Transportation Screening Complete  PCP or Specialist Appt within 3-5 Days Complete  HRI or  Home Care Consult Complete  Social Work Consult for Recovery Care Planning/Counseling Complete  Palliative Care Screening Not Applicable  Medication Review Oceanographer) Complete  Some recent data might be hidden

## 2020-04-12 NOTE — Progress Notes (Signed)
PROGRESS NOTE    Patient: Lindsay Miranda                            PCP: Patient, No Pcp Per                    DOB: 1990-09-22            DOA: 04/09/2020 HYH:[id number]             DOS: 04/12/2020, 12:26 PM   LOS: 1 day   Date of Service: The patient was seen and examined on 04/12/2020  Subjective:  The patient was seen and examined this morning, stable still audibly wheezing but improved Complain of mild shortness of breath, but remains on room air satting 94%.    Brief Narrative:    Lindsay Miranda is a 30 y.o. female with medical history significant of heroin abuse, bipolar disorder, MDD, anxiety chronically on benzodiazepines, hypertension, recent hospitalization 2/4-2/6 from benzodiazepine withdrawal presented to the ED with complaints of fever, cough, chest pain, and shortness of breath.   She is not vaccinated against COVID. Reports history of COPD and emphysema. Denies current opiate use. States Subutex was tapered off during her recent hospitalization but when she went to her PCP, he restarted it with plan to taper it off over a longer period of time. Also states her PCP increased the dose of her Klonopin as she was in the hospital for withdrawal.  ED Course: Febrile with temperature 100.4 F.  Tachycardic and tachypneic.  Blood pressure low with systolic in the 82S.  Satting 90-92% on room air, placed on 2 L supplemental oxygen.  WBC 7.6 EKG without acute ischemic changes.  Lactic acid 2.1 >2.1.   SARS-CoV-2 PCR test negative.    UA not suggestive of infection.  Chest x-ray showing dense left basilar consolidation.   CT angiogram chest showing moderate to marked severely bilateral multifocal infiltrates with a large area of infiltrate within the perihilar regions of the left upper lobe and left lower lobe.    Assessment & Plan:   Principal Problem:   Pneumonia Active Problems:   MDD (major depressive disorder), recurrent severe, without psychosis ([place])   Severe  sepsis (HCC)   Chest pain   Tobacco use   Severe sepsis secondary to CAP:  -Sepsis physiology resolved, hemodynamically stable -Satting 94% on room air  The patient met the criteria on admission (fever, tachycardia, tachypnea, lactic acidosis,  hypotension )--- much improved CT angiogram chest showing moderate to marked severely bilateral multifocal infiltrates with a large area of infiltrate within the perihilar regions of the left upper lobe and left lower lobe.  SARS-CoV-2 PCR test negative.  Influenza panel negative.   -Currently hemodynamically stable, improved lactic acid, no leukocytosis -Status post IV fluid resuscitation per protocol >> will discontinue -We will continue current antibiotics Rocephin azithromycin -Blood/sputum cultures >> no growth to date -Leukocytosis 17.6 today thought to be due to steroids which will be tapered down accordingly   Acute COPD exacerbation:  -Improving rhonchi, and wheezing -She has been started on IV Solu-Medrol 60 every 6 >> will taper down-Continue pulmonary toiletry -We will continue DuoNeb bronchodilator treatment, mucolytic's -    Acute hypoxemic respiratory failure:  -Likely due to pneumonia, COPD exacerbation -Improved, currently on room air satting 94% -On admission was as high as 4 L then 2 L was weaned off   Chest pain:  -Denies of having  any further chest pain High-sensitivity troponin mildly elevated but stable (25 >27).  EKG without acute ischemic changes.  CTA chest negative for PE.  -Resolved, will discontinue heart monitoring  -Urine drug screen positive for amphetamine, benzodiazepine, and marijuana  History of heroin abuse/substance abuse -Subutex not on formulary, discussed with pharmacy and switched to equivalent dose of Suboxone -Once again urine drug screen is positive amphetamine, benzodiazepine, and marijuana -Discussed with patient in detail as father was present at bedside   Bipolar disorder,  MDD -Continue home meds  Anxiety:  Chronically on benzodiazepines and was recently admitted for withdrawal after Klonopin was discontinued abruptly. -Continue home Klonopin to be continued with tapering to be continued for now with slow tapering  Tobacco use -NicoDerm patch and counseling  ? H/o Hypertension: Not on any antihypertensives at home. -concern for severe sepsis and mild hypotension -Blood pressure has stabilized     DVT prophylaxis: Lovenox Code Status: Full code Family Communication: No family available at this time. Disposition Plan: Status is: Observation  The patient remains OBS appropriate and will d/c before 2 midnights.  Dispo: The patient is from: Home  Anticipated d/c is to: Home  Anticipated d/c date is: 2-3 days  Patient currently is not medically stable to d/c.              Difficult to place patient No  Level of care: Level of care: Telemetry   ------------------------------------------------------------------------------------------------------------------------------------ Nutritional status:  The patient's BMI is: Body mass index is 27.46 kg/m. I agree with the assessment and plan as outlined--   ------------------------------------------------------------------------------------------------------------------------------------ Cultures; Blood Cultures x 2 >>   Sputum Culture >>    Antimicrobials:   Level of care: Telemetry   Procedures:   No admission procedures for hospital encounter.    Antimicrobials:  Anti-infectives (From admission, onward)   Start     Dose/Rate Route Frequency Ordered Stop   04/11/20 1000  azithromycin (ZITHROMAX) tablet 500 mg        500 mg Oral Daily 04/11/20 0845     04/10/20 1800  cefTRIAXone (ROCEPHIN) 1 g in sodium chloride 0.9 % 100 mL IVPB        1 g 200 mL/hr over 30 Minutes Intravenous Every 24 hours 04/10/20 0455     04/10/20 1800  azithromycin  (ZITHROMAX) 500 mg in sodium chloride 0.9 % 250 mL IVPB  Status:  Discontinued        500 mg 250 mL/hr over 60 Minutes Intravenous Every 24 hours 04/10/20 0455 04/11/20 0845   04/09/20 1815  cefTRIAXone (ROCEPHIN) 1 g in sodium chloride 0.9 % 100 mL IVPB        1 g 200 mL/hr over 30 Minutes Intravenous  Once 04/09/20 1805 04/09/20 1942   04/09/20 1815  azithromycin (ZITHROMAX) 500 mg in sodium chloride 0.9 % 250 mL IVPB        500 mg 250 mL/hr over 60 Minutes Intravenous  Once 04/09/20 1805 04/09/20 2041       Medication:  . acidophilus  2 capsule Oral TID  . azithromycin  500 mg Oral Daily  . enoxaparin (LOVENOX) injection  40 mg Subcutaneous Q24H  . gabapentin  800 mg Oral QID  . guaiFENesin  600 mg Oral BID  . ipratropium-albuterol  3 mL Nebulization TID  . methylPREDNISolone (SOLU-MEDROL) injection  60 mg Intravenous Q12H  . nicotine  14 mg Transdermal Daily  . QUEtiapine  50 mg Oral QHS  . sertraline  100 mg Oral q AM  .  sertraline  50 mg Oral q AM    acetaminophen **OR** acetaminophen, albuterol, clonazePAM, haloperidol   Objective:   Vitals:   04/11/20 2006 04/11/20 2130 04/12/20 0631 04/12/20 1156  BP:  121/87 (!) 125/93 (!) 131/92  Pulse:  85 81 84  Resp:  _0 Temp:  98.2 F (36.8 C) 98.2 F (36.8 C) 98 F (36.7 C)  TempSrc:  Oral Oral Oral  SpO2: 95% 93% 92% 94%  Weight:      Height:        Intake/Output Summary (Last 24 hours) at 04/12/2020 1226 Last data filed at 04/12/2020 0700 Gross per 24 hour  Intake 932 ml  Output --  Net 932 ml   Filed Weights   04/09/20 1617  Weight: 74.8 kg     Examination:       Physical Exam:   General:  Alert, oriented, cooperative, no distress;   HEENT:  Normocephalic, PERRL, otherwise with in Normal limits   Neuro:  CNII-XII intact. , normal motor and sensation, reflexes intact   Lungs:   Mild SOB , Respirations unlabored, + wheezes /  No crackles  Cardio:    S1/S2, RRR, No murmure, No Rubs or  Gallops   Abdomen:   Soft, non-tender, bowel sounds active all four quadrants,  no guarding or peritoneal signs.  Muscular skeletal:  Limited exam - in bed, able to move all 4 extremities, Normal strength,  2+ pulses,  symmetric, No pitting edema  Skin:  Dry, warm to touch, negative for any Rashes,  Wounds: Please see nursing documentation         -----------------------------------------------------------------------------------------------------------------------------------    LABs:  CBC Latest Ref Rng & Units 04/12/2020 04/11/2020 04/09/2020  WBC 4.0 - 10.5 K/uL 17.6(H) 13.8(H) 7.6  Hemoglobin 12.0 - 15.0 g/dL 11.6(L) 11.2(L) 13.9  Hematocrit 36.0 - 46.0 % 33.9(L) 32.2(L) 39.0  Platelets 150 - 400 K/uL 293 266 254   CMP Latest Ref Rng & Units 04/12/2020 04/11/2020 04/09/2020  Glucose 70 - 99 mg/dL 119(H) 130(H) 113(H)  BUN 6 - 20 mg/dL _1 Creatinine 0.44 - 1.00 mg/dL 0.57 0.59 0.90  Sodium 135 - 145 mmol/L 139 139 133(L)  Potassium 3.5 - 5.1 mmol/L 4.2 4.2 4.1  Chloride 98 - 111 mmol/L 108 109 102  CO2 22 - 32 mmol/L _2 Calcium 8.9 - 10.3 mg/dL 8.8(L) 8.6(L) 8.3(L)  Total Protein 6.5 - 8.1 g/dL - - 6.1(L)  Total Bilirubin 0.3 - 1.2 mg/dL - - 0.8  Alkaline Phos 38 - 126 U/L - - 55  AST 15 - 41 U/L - - 69(H)  ALT 0 - 44 U/L - - 36       Micro Results Recent Results (from the past 240 hour(s))  Resp Panel by RT-PCR (Flu A&B, Covid) Nasopharyngeal Swab     Status: None   Collection Time: 04/09/20  4:51 PM   Specimen: Nasopharyngeal Swab; Nasopharyngeal(NP) swabs in vial transport medium  Result Value Ref Range Status   SARS Coronavirus 2 by RT PCR NEGATIVE NEGATIVE Final    Comment: (NOTE) SARS-CoV-2 target nucleic acids are NOT DETECTED.  The SARS-CoV-2 RNA is generally detectable in upper respiratory specimens during the acute phase of infection. The lowest concentration of SARS-CoV-2 viral copies this assay can detect is 138 copies/mL. A negative result  does not preclude SARS-Cov-2 infection and should not be used as the sole basis for treatment or other patient management decisions. A negative result  may occur with  improper specimen collection/handling, submission of specimen other than nasopharyngeal swab, presence of viral mutation(s) within the areas targeted by this assay, and inadequate number of viral copies(<138 copies/mL). A negative result must be combined with clinical observations, patient history, and epidemiological information. The expected result is Negative.  Fact Sheet for Patients:  BloggerCourse.com  Fact Sheet for Healthcare Providers:  SeriousBroker.it  This test is no t yet approved or cleared by the [place] FDA and  has been authorized for detection and/or diagnosis of SARS-CoV-2 by FDA under an Emergency Use Authorization (EUA). This EUA will remain  in effect (meaning this test can be used) for the duration of the COVID-19 declaration under Section 564(b)(1) of the Act, 21 U.S.C.section 360bbb-3(b)(1), unless the authorization is terminated  or revoked sooner.       Influenza A by PCR NEGATIVE NEGATIVE Final   Influenza B by PCR NEGATIVE NEGATIVE Final    Comment: (NOTE) The Xpert Xpress SARS-CoV-2/FLU/RSV plus assay is intended as an aid in the diagnosis of influenza from Nasopharyngeal swab specimens and should not be used as a sole basis for treatment. Nasal washings and aspirates are unacceptable for Xpert Xpress SARS-CoV-2/FLU/RSV testing.  Fact Sheet for Patients: BloggerCourse.com  Fact Sheet for Healthcare Providers: SeriousBroker.it  This test is not yet approved or cleared by the [place] FDA and has been authorized for detection and/or diagnosis of SARS-CoV-2 by FDA under an Emergency Use Authorization (EUA). This EUA will remain in effect (meaning this test can be used) for  the duration of the COVID-19 declaration under Section 564(b)(1) of the Act, 21 U.S.C. section 360bbb-3(b)(1), unless the authorization is terminated or revoked.  Performed at [facility], [address] [place], [place] [address]   Blood culture (routine x 2)     Status: None (Preliminary result)   Collection Time: 04/09/20  4:55 PM   Specimen: BLOOD  Result Value Ref Range Status   Specimen Description   Final    BLOOD LEFT ANTECUBITAL Performed at [facility], [address] [place], [place] [address]    Special Requests   Final    BOTTLES DRAWN AEROBIC AND ANAEROBIC Blood Culture adequate volume Performed at [facility], [address] [place], [place] [address]    Culture   Final    NO GROWTH 3 DAYS Performed at [facility], [address] [place], [place] [address]    Report Status PENDING  Incomplete  Blood culture (routine x 2)     Status: None (Preliminary result)   Collection Time: 04/09/20  5:14 PM   Specimen: BLOOD  Result Value Ref Range Status   Specimen Description   Final    BLOOD BLOOD RIGHT HAND Performed at [facility], [address] [place], [place] [address]    Special Requests   Final    BOTTLES DRAWN AEROBIC AND ANAEROBIC Blood Culture adequate volume Performed at [facility], [address] [place], [place] [address]    Culture   Final    NO GROWTH 3 DAYS Performed at [facility], [address] [place], [place] [address]    Report Status PENDING  Incomplete    Radiology Reports CT Angio Chest PE W and/or Wo Contrast  Result Date: 04/09/2020 CLINICAL DATA:  Cough, fever and chest pain. EXAM: CT ANGIOGRAPHY CHEST WITH CONTRAST TECHNIQUE: Multidetector CT imaging of the chest  was performed using the standard protocol during bolus administration of intravenous contrast. Multiplanar CT image reconstructions and MIPs were obtained to evaluate the vascular anatomy.  CONTRAST:  157m OMNIPAQUE IOHEXOL 350 MG/ML SOLN COMPARISON:  None. FINDINGS: Cardiovascular: Satisfactory opacification of the pulmonary arteries to the segmental level. No evidence of pulmonary embolism. Normal heart size. No pericardial effusion. Mediastinum/Nodes: No enlarged mediastinal, hilar, or axillary lymph nodes. Thyroid gland, trachea, and esophagus demonstrate no significant findings. Lungs/Pleura: Moderate to marked severity bilateral multifocal infiltrates are seen. A large area of infiltrate is seen within the perihilar regions of the left upper lobe and left lower lobe. There is a very small left pleural effusion. No pneumothorax is identified. Upper Abdomen: No acute abnormality. Musculoskeletal: No chest wall abnormality. No acute or significant osseous findings. Review of the MIP images confirms the above findings. IMPRESSION: 1. Moderate to marked severity bilateral multifocal infiltrates with a large area of infiltrate within the perihilar regions of the left upper lobe and left lower lobe. Follow-up to resolution is recommended to exclude the presence of an underlying neoplastic process. 2. Very small left pleural effusion. Electronically Signed   By: TVirgina NorfolkM.D.   On: 04/09/2020 19:42   DG Chest Portable 1 View  Result Date: 04/09/2020 CLINICAL DATA:  Cough and fever since yesterday EXAM: PORTABLE CHEST 1 VIEW COMPARISON:  03/26/2020 FINDINGS: Single frontal view of the chest demonstrates dense left basilar consolidation consistent with pneumonia. No large effusion or pneumothorax. No acute bony abnormalities. IMPRESSION: 1. Dense left basilar consolidation consistent with pneumonia. Electronically Signed   By: MRanda NgoM.D.   On: 04/09/2020 17:13   DG Chest Portable 1 View  Result Date: 03/26/2020 CLINICAL DATA:  Drug withdrawal EXAM: PORTABLE CHEST 1 VIEW COMPARISON:  None. FINDINGS: The cardiomediastinal silhouette is normal in contour. No pleural effusion. No  pneumothorax. No acute pleuroparenchymal abnormality. Visualized abdomen is unremarkable. No acute osseous abnormality noted. Linear structures overlying the upper shoulders likely external to patient. IMPRESSION: No acute cardiopulmonary abnormality. Electronically Signed   By: SValentino SaxonMD   On: 03/26/2020 11:58    SIGNED: SDeatra James MD, FHM. [facility],  Pager (please use [email or web address] to page/text) Please use Epic Secure Chat for non-urgent communication (7AM-7PM)  If 7PM-7AM, please contact night-coverage [email or web address], 04/12/2020, 12:26 PM

## 2020-04-13 LAB — CBC
HCT: 33.1 % — ABNORMAL LOW (ref 36.0–46.0)
Hemoglobin: 11.2 g/dL — ABNORMAL LOW (ref 12.0–15.0)
MCH: 31.9 pg (ref 26.0–34.0)
MCHC: 33.8 g/dL (ref 30.0–36.0)
MCV: 94.3 fL (ref 80.0–100.0)
Platelets: 276 10*3/uL (ref 150–400)
RBC: 3.51 MIL/uL — ABNORMAL LOW (ref 3.87–5.11)
RDW: 13.2 % (ref 11.5–15.5)
WBC: 20.6 10*3/uL — ABNORMAL HIGH (ref 4.0–10.5)
nRBC: 0.3 % — ABNORMAL HIGH (ref 0.0–0.2)

## 2020-04-13 LAB — PROCALCITONIN: Procalcitonin: 2 ng/mL

## 2020-04-13 LAB — BASIC METABOLIC PANEL
Anion gap: 9 (ref 5–15)
BUN: 20 mg/dL (ref 6–20)
CO2: 26 mmol/L (ref 22–32)
Calcium: 8.5 mg/dL — ABNORMAL LOW (ref 8.9–10.3)
Chloride: 105 mmol/L (ref 98–111)
Creatinine, Ser: 0.62 mg/dL (ref 0.44–1.00)
GFR, Estimated: >60 mL/min (ref >60)
Glucose, Bld: 101 mg/dL — ABNORMAL HIGH (ref 70–99)
Potassium: 4.2 mmol/L (ref 3.5–5.1)
Sodium: 140 mmol/L (ref 135–145)

## 2020-04-13 MED ORDER — LORAZEPAM 1 MG PO TABS
1.0000 mg | ORAL_TABLET | Freq: Once | ORAL | Status: AC
  Administered 2020-04-13: 1 mg via ORAL
  Filled 2020-04-13: qty 1

## 2020-04-13 MED ORDER — RISAQUAD PO CAPS: 2.0000 | ORAL_CAPSULE | Freq: Three times a day (TID) | ORAL | 0 refills | Status: AC

## 2020-04-13 MED ORDER — NICOTINE 14 MG/24HR TD PT24: 14.0000 mg | MEDICATED_PATCH | Freq: Every day | TRANSDERMAL | 2 refills | Status: AC

## 2020-04-13 MED ORDER — METHYLPREDNISOLONE SODIUM SUCC 125 MG IJ SOLR: 60.0000 mg | Freq: Every day | INTRAMUSCULAR | Status: DC

## 2020-04-13 MED ORDER — LEVOFLOXACIN 750 MG PO TABS: 750.0000 mg | ORAL_TABLET | Freq: Every day | ORAL | 0 refills | Status: AC

## 2020-04-13 MED ORDER — LEVOFLOXACIN 750 MG PO TABS
750.0000 mg | ORAL_TABLET | Freq: Every day | ORAL | Status: DC
Start: 2020-04-13 — End: 2020-04-13
  Administered 2020-04-13: 750 mg via ORAL
  Filled 2020-04-13: qty 1

## 2020-04-13 MED ORDER — METHYLPREDNISOLONE 4 MG PO TBPK: ORAL_TABLET | ORAL | 0 refills | Status: AC

## 2020-04-13 NOTE — Plan of Care (Signed)
  Problem: Health Behavior/Discharge Planning: Goal: Ability to manage health-related needs will improve Outcome: Progressing   Problem: Clinical Measurements: Goal: Respiratory complications will improve Outcome: Progressing   Problem: Coping: Goal: Level of anxiety will decrease Outcome: Progressing   Problem: Pain Managment: Goal: General experience of comfort will improve Outcome: Progressing   

## 2020-04-13 NOTE — TOC Transition Note (Signed)
Transition of Care [facility]) - CM/SW Discharge Note   Patient Details  Name: JASMEN EMRICH MRN: [id number] Date of Birth: 12-26-1990  Transition of Care [facility] CM/SW Contact:  Lanier Clam, RN Phone Number: 04/13/2020, 12:16 PM   Clinical Narrative: d/c home-provided with pcp listing from [facility] No further CM needs.     Final next level of care: Home/Self Care Barriers to Discharge: No Barriers Identified   Patient Goals and CMS Choice Patient states their goals for this hospitalization and ongoing recovery are:: go home CMS Medicare.gov Compare Post Acute Care list provided to:: Patient    Discharge Placement                       Discharge Plan and Services   Discharge Planning Services: CM Consult                                 Social Determinants of Health (SDOH) Interventions     Readmission Risk Interventions Readmission Risk Prevention Plan 04/12/2020  Transportation Screening Complete  PCP or Specialist Appt within 3-5 Days Complete  HRI or Home Care Consult Complete  Social Work Consult for Recovery Care Planning/Counseling Complete  Palliative Care Screening Not Applicable  Medication Review [employer]) Complete  Some recent data might be hidden

## 2020-04-13 NOTE — Progress Notes (Signed)
Pt given and explained discharge instructions. All questions answered. Pt also given paper from case manager explaining which primary care providers were available near her home. Pt educated on importance of following up with a PCP following discharge from the hospital. Pt verbalized understanding of all discharge instructions. IV removed. Pt was dressed in personal clothing. Pt taken to main entrance via wheelchair with all belongings.

## 2020-04-13 NOTE — Discharge Summary (Signed)
Physician Discharge Summary [facility]    Patient: Lindsay Miranda                   Admit date: 04/09/2020   DOB: 10/07/1990             Discharge date:04/13/2020/10:58 AM KXF:[id number]                          PCP: Patient, No Pcp Per  Disposition: HOME  Recommendations for Outpatient Follow-up:   . Follow up: in 1 week  Discharge Condition: Stable   Code Status:   Code Status: Full Code  Diet recommendation: Regular healthy diet   Discharge Diagnoses:    Principal Problem:   Pneumonia Active Problems:   MDD (major depressive disorder), recurrent severe, without psychosis ([place])   Severe sepsis ([place])   Chest pain   Tobacco use   History of Present Illness/ Hospital Course Kathleen Argue Summary:   Torii Royse Garlandis a 30 y.o.femalewith medical history significant ofheroin abuse, bipolar disorder,MDD,anxiety chronically on benzodiazepines, hypertension, recent hospitalization 2/4-2/6 from benzodiazepine withdrawalpresented to the ED with complaints of fever, cough, chest pain, and shortness of breath.  She is not vaccinated against COVID. Reports history of COPD and emphysema. Denies current opiate use. States Subutex was tapered off during her recent hospitalization but when she went to her PCP, he restarted it with plan to taper it off over a longer period of time. Also states her PCP increasedthe dose of her Klonopin as she was in the hospital for withdrawal.  ED Course:Febrile with temperature 100.4 F. Tachycardic and tachypneic. Blood pressure low with systolic in the 69C. Satting 90-92% on room air, placed on 2 L supplemental oxygen. WBC 7.6 EKG without acute ischemic changes. Lactic acid 2.1 >2.1.  SARS-CoV-2 PCR test negative.    UA not suggestive of infection.  Chest x-ray showing dense left basilar consolidation.  CT angiogram chest showing moderate to marked severely bilateral multifocal infiltrates with a large area of  infiltrate within the perihilar regions of the left upper lobe and left lower lobe.     Severe sepsissecondary to CAP: -Sepsis physiology resolved, hemodynamically stable -Satting greater than 94% on room air,  The patient met the criteria on admission (fever, tachycardia, tachypnea, lactic acidosis,  hypotension )--- much improved CT angiogram chest showing moderate to marked severely bilateral multifocal infiltrates with a large area of infiltrate within the perihilar regions of the left upper lobe and left lower lobe.  SARS-CoV-2 PCR test negative. Influenza panel negative.  -Currently hemodynamically stable, improved lactic acid, ... leukocytosis -Status post IV fluid resuscitation per protocol >> will discontinue -has been on  Rocephin and azithromycin >>> will be switched to p.o. Levaquin. -Blood/sputum cultures >> no growth to date -Leukocytosis 17.6 >>> 20 ... But there is no signs of infection likely due to high-dose steroids Patient requesting if she can be discharged, we will switch her to p.o. antibiotics of Levaquin Will clear her for discharge.   Acute COPD exacerbation: -Improving rhonchi, and wheezing -She has been started on IV Solu-Medrol 60 every 6 >> will taper down-Continue pulmonary toiletry -We will continue DuoNeb bronchodilator treatment, mucolytic's -We will switch her to Medrol Dosepak taper.    Acute hypoxemic respiratory failure: -Likely due to pneumonia, COPD exacerbation -On admission was as high as 4 L then 2 L was weaned off -Much improved on room air, satting greater 94%.   Chest pain:  -Denies  of having any further chest pain High-sensitivity troponin mildly elevated but stable (25>27).EKG without acute ischemic changes. CTA chest negative for PE. -Resolved, will discontinue heart monitoring -Urine drug screen positive for amphetamine, benzodiazepine, and marijuana  History of heroin abuse/substance abuse -Subutex  notonformulary, discussed with pharmacy and switchedto equivalent dose of Suboxone -Once again urine drug screen is positive amphetamine, benzodiazepine, and marijuana -Discussed with patient in detail as father was present at bedside   Bipolar disorder, MDD -Continue home meds  Anxiety: Chronically on benzodiazepines and was recently admitted for withdrawal after Klonopin was discontinued abruptly. -Continue home Klonopin to be continued with tapering to be continued for now with slow tapering  Tobacco use -NicoDerm patch and counseling  ? H/o Hypertension: Not on any antihypertensives at home. -concern for severe sepsis and mild hypotension -Blood pressure has stabilized    Family Communication:No family available at this time.... Discussed with her follow-up at bedside 2 days ago.   Dispo: The patient is from:Home Anticipated d/c is GX:[id number]      Discharge Instructions:   Discharge Instructions    Activity as tolerated - No restrictions   Complete by: As directed    Diet - low sodium heart healthy   Complete by: As directed    Discharge instructions   Complete by: As directed    F/up with PCP ---in 1-2 days ... needs to F/up with Psych. In 1 -2 wks   Increase activity slowly   Complete by: As directed        Medication List    STOP taking these medications   benztropine 0.5 MG tablet Commonly known as: COGENTIN   buprenorphine 8 MG Subl SL tablet Commonly known as: SUBUTEX   cloNIDine 0.1 MG tablet Commonly known as: CATAPRES   haloperidol 10 MG tablet Commonly known as: HALDOL   PRENATAL GUMMIES PO     TAKE these medications   acidophilus Caps capsule Take 2 capsules by mouth 3 (three) times daily for 10 days.   Adderall 20 MG tablet Generic drug: amphetamine-dextroamphetamine Take 20 mg by mouth 2 (two) times daily.   AIRBORNE GUMMIES PO Take 1 tablet by mouth daily.   albuterol 108 (90 Base) MCG/ACT  inhaler Commonly known as: VENTOLIN HFA Inhale 2 puffs into the lungs every 4 (four) hours as needed for wheezing or shortness of breath.   clonazePAM 1 MG tablet Commonly known as: KLONOPIN Take 1 tablet (1 mg total) by mouth 3 (three) times daily as needed for anxiety. What changed: how much to take   gabapentin 800 MG tablet Commonly known as: NEURONTIN Take 1 tablet (800 mg total) by mouth 4 (four) times daily.   levofloxacin 750 MG tablet Commonly known as: LEVAQUIN Take 1 tablet (750 mg total) by mouth daily for 5 days. Start taking on: April 14, 2020   methylPREDNISolone 4 MG Tbpk tablet Commonly known as: MEDROL DOSEPAK Medrol Dosepak take as instructed   nicotine 14 mg/24hr patch Commonly known as: NICODERM CQ - dosed in mg/24 hours Place 1 patch (14 mg total) onto the skin daily for 28 days. Start taking on: April 14, 2020   QUEtiapine 50 MG tablet Commonly known as: SEROQUEL Take 1 tablet (50 mg total) by mouth at bedtime.   sertraline 100 MG tablet Commonly known as: ZOLOFT Take 1 tablet (100 mg total) by mouth in the morning. Take with 63m tablet every morning to equal 154mWhat changed: additional instructions   sertraline 50 MG tablet Commonly known  as: ZOLOFT Take 1 tablet (50 mg total) by mouth in the morning. Take with 126m tablet every morning to equal 1529mWhat changed: additional instructions       Allergies  Allergen Reactions  . Risperdal [Risperidone] Other (See Comments)    Jaw locks.   . Suboxone [Buprenorphine Hcl-Naloxone Hcl] Nausea And Vomiting    Patient states she took suboxone for 2 months in the past and had one episode of vomiting during pregnancy.       Procedures /Studies:   CT Angio Chest PE W and/or Wo Contrast  Result Date: 04/09/2020 CLINICAL DATA:  Cough, fever and chest pain. EXAM: CT ANGIOGRAPHY CHEST WITH CONTRAST TECHNIQUE: Multidetector CT imaging of the chest was performed using the standard protocol  during bolus administration of intravenous contrast. Multiplanar CT image reconstructions and MIPs were obtained to evaluate the vascular anatomy. CONTRAST:  10013mMNIPAQUE IOHEXOL 350 MG/ML SOLN COMPARISON:  None. FINDINGS: Cardiovascular: Satisfactory opacification of the pulmonary arteries to the segmental level. No evidence of pulmonary embolism. Normal heart size. No pericardial effusion. Mediastinum/Nodes: No enlarged mediastinal, hilar, or axillary lymph nodes. Thyroid gland, trachea, and esophagus demonstrate no significant findings. Lungs/Pleura: Moderate to marked severity bilateral multifocal infiltrates are seen. A large area of infiltrate is seen within the perihilar regions of the left upper lobe and left lower lobe. There is a very small left pleural effusion. No pneumothorax is identified. Upper Abdomen: No acute abnormality. Musculoskeletal: No chest wall abnormality. No acute or significant osseous findings. Review of the MIP images confirms the above findings. IMPRESSION: 1. Moderate to marked severity bilateral multifocal infiltrates with a large area of infiltrate within the perihilar regions of the left upper lobe and left lower lobe. Follow-up to resolution is recommended to exclude the presence of an underlying neoplastic process. 2. Very small left pleural effusion. Electronically Signed   By: ThaVirgina NorfolkD.   On: 04/09/2020 19:42   DG Chest Portable 1 View  Result Date: 04/09/2020 CLINICAL DATA:  Cough and fever since yesterday EXAM: PORTABLE CHEST 1 VIEW COMPARISON:  03/26/2020 FINDINGS: Single frontal view of the chest demonstrates dense left basilar consolidation consistent with pneumonia. No large effusion or pneumothorax. No acute bony abnormalities. IMPRESSION: 1. Dense left basilar consolidation consistent with pneumonia. Electronically Signed   By: MicRanda NgoD.   On: 04/09/2020 17:13   DG Chest Portable 1 View  Result Date: 03/26/2020 CLINICAL DATA:  Drug  withdrawal EXAM: PORTABLE CHEST 1 VIEW COMPARISON:  None. FINDINGS: The cardiomediastinal silhouette is normal in contour. No pleural effusion. No pneumothorax. No acute pleuroparenchymal abnormality. Visualized abdomen is unremarkable. No acute osseous abnormality noted. Linear structures overlying the upper shoulders likely external to patient. IMPRESSION: No acute cardiopulmonary abnormality. Electronically Signed   By: SteValentino Saxon   On: 03/26/2020 11:58     Subjective:   Patient was seen and examined 04/13/2020, 10:58 AM Patient stable today. No acute distress.  No issues overnight Stable for discharge.  Discharge Exam:    Vitals:   04/11/20 2130 04/12/20 0631 04/12/20 1156 04/13/20 0416  BP: 121/87 (!) 125/93 (!) 131/92 (!) 128/96  Pulse: 85 81 84 66  Resp: _0 Temp: 98.2 F (36.8 C) 98.2 F (36.8 C) 98 F (36.7 C) 98.4 F (36.9 C)  TempSrc: Oral Oral Oral   SpO2: 93% 92% 94% 95%  Weight:      Height:        General: Pt lying comfortably in  bed & appears in no obvious distress. Cardiovascular: S1 & S2 heard, RRR, S1/S2 +. No murmurs, rubs, gallops or clicks. No JVD or pedal edema. Respiratory: Clear to auscultation without wheezing, rhonchi or crackles. No increased work of breathing. Abdominal:  Non-distended, non-tender & soft. No organomegaly or masses appreciated. Normal bowel sounds heard. CNS: Alert and oriented. No focal deficits. Extremities: no edema, no cyanosis      The results of significant diagnostics from this hospitalization (including imaging, microbiology, ancillary and laboratory) are listed below for reference.      Microbiology:   Recent Results (from the past 240 hour(s))  Resp Panel by RT-PCR (Flu A&B, Covid) Nasopharyngeal Swab     Status: None   Collection Time: 04/09/20  4:51 PM   Specimen: Nasopharyngeal Swab; Nasopharyngeal(NP) swabs in vial transport medium  Result Value Ref Range Status   SARS Coronavirus 2 by RT  PCR NEGATIVE NEGATIVE Final    Comment: (NOTE) SARS-CoV-2 target nucleic acids are NOT DETECTED.  The SARS-CoV-2 RNA is generally detectable in upper respiratory specimens during the acute phase of infection. The lowest concentration of SARS-CoV-2 viral copies this assay can detect is 138 copies/mL. A negative result does not preclude SARS-Cov-2 infection and should not be used as the sole basis for treatment or other patient management decisions. A negative result may occur with  improper specimen collection/handling, submission of specimen other than nasopharyngeal swab, presence of viral mutation(s) within the areas targeted by this assay, and inadequate number of viral copies(<138 copies/mL). A negative result must be combined with clinical observations, patient history, and epidemiological information. The expected result is Negative.  Fact Sheet for Patients:  EntrepreneurPulse.com.au  Fact Sheet for Healthcare Providers:  IncredibleEmployment.be  This test is no t yet approved or cleared by the [place] FDA and  has been authorized for detection and/or diagnosis of SARS-CoV-2 by FDA under an Emergency Use Authorization (EUA). This EUA will remain  in effect (meaning this test can be used) for the duration of the COVID-19 declaration under Section 564(b)(1) of the Act, 21 U.S.C.section 360bbb-3(b)(1), unless the authorization is terminated  or revoked sooner.       Influenza A by PCR NEGATIVE NEGATIVE Final   Influenza B by PCR NEGATIVE NEGATIVE Final    Comment: (NOTE) The Xpert Xpress SARS-CoV-2/FLU/RSV plus assay is intended as an aid in the diagnosis of influenza from Nasopharyngeal swab specimens and should not be used as a sole basis for treatment. Nasal washings and aspirates are unacceptable for Xpert Xpress SARS-CoV-2/FLU/RSV testing.  Fact Sheet for Patients: EntrepreneurPulse.com.au  Fact Sheet for  Healthcare Providers: IncredibleEmployment.be  This test is not yet approved or cleared by the [place] FDA and has been authorized for detection and/or diagnosis of SARS-CoV-2 by FDA under an Emergency Use Authorization (EUA). This EUA will remain in effect (meaning this test can be used) for the duration of the COVID-19 declaration under Section 564(b)(1) of the Act, 21 U.S.C. section 360bbb-3(b)(1), unless the authorization is terminated or revoked.  Performed at [facility], [address] [place], [place] [address]   Blood culture (routine x 2)     Status: None (Preliminary result)   Collection Time: 04/09/20  4:55 PM   Specimen: BLOOD  Result Value Ref Range Status   Specimen Description   Final    BLOOD LEFT ANTECUBITAL Performed at [facility], [address] [place], [place] [address]    Special Requests  Final    BOTTLES DRAWN AEROBIC AND ANAEROBIC Blood Culture adequate volume Performed at [facility], [address] [place], [place] [address]    Culture   Final    NO GROWTH 4 DAYS Performed at [facility], [place] [address], [place], [place] [address]    Report Status PENDING  Incomplete  Blood culture (routine x 2)     Status: None (Preliminary result)   Collection Time: 04/09/20  5:14 PM   Specimen: BLOOD  Result Value Ref Range Status   Specimen Description   Final    BLOOD BLOOD RIGHT HAND Performed at [facility], [address] [place], [place] [address]    Special Requests   Final    BOTTLES DRAWN AEROBIC AND ANAEROBIC Blood Culture adequate volume Performed at [facility], [address] [place], [place] [address]    Culture   Final    NO GROWTH 4 DAYS Performed at [facility], [place] [address], [place], [place] [address]    Report Status PENDING  Incomplete     Labs:   CBC: Recent Labs  Lab 04/09/20 1651 04/11/20 0543 04/12/20 0504  04/13/20 0528  WBC 7.6 13.8* 17.6* 20.6*  NEUTROABS 5.7  --   --   --   HGB 13.9 11.2* 11.6* 11.2*  HCT 39.0 32.2* 33.9* 33.1*  MCV 91.5 94.7 95.2 94.3  PLT 254 266 293 190   Basic Metabolic Panel: Recent Labs  Lab 04/09/20 1651 04/11/20 0543 04/12/20 0504 04/13/20 0528  NA 133* 139 139 140  K 4.1 4.2 4.2 4.2  CL 102 109 108 105  CO2 _0 GLUCOSE 113* 130* 119* 101*  BUN _1 CREATININE 0.90 0.59 0.57 0.62  CALCIUM 8.3* 8.6* 8.8* 8.5*   Liver Function Tests: Recent Labs  Lab 04/09/20 1651  AST 69*  ALT 36  ALKPHOS 55  BILITOT 0.8  PROT 6.1*  ALBUMIN 3.0*   BNP (last 3 results) No results for input(s): BNP in the last 8760 hours. Cardiac Enzymes: No results for input(s): CKTOTAL, CKMB, CKMBINDEX, TROPONINI in the last 168 hours. CBG: No results for input(s): GLUCAP in the last 168 hours. Hgb A1c No results for input(s): HGBA1C in the last 72 hours. Lipid Profile No results for input(s): CHOL, HDL, LDLCALC, TRIG, CHOLHDL, LDLDIRECT in the last 72 hours. Thyroid function studies No results for input(s): TSH, T4TOTAL, T3FREE, THYROIDAB in the last 72 hours.  Invalid input(s): FREET3 Anemia work up No results for input(s): VITAMINB12, FOLATE, FERRITIN, TIBC, IRON, RETICCTPCT in the last 72 hours. Urinalysis    Component Value Date/Time   COLORURINE YELLOW 04/09/2020 1851   APPEARANCEUR HAZY (A) 04/09/2020 1851   LABSPEC 1.005 04/09/2020 1851   PHURINE 6.0 04/09/2020 1851   GLUCOSEU NEGATIVE 04/09/2020 1851   HGBUR TRACE (A) 04/09/2020 1851   BILIRUBINUR NEGATIVE 04/09/2020 1851   [place] 04/09/2020 1851   PROTEINUR NEGATIVE 04/09/2020 1851   NITRITE NEGATIVE 04/09/2020 1851   LEUKOCYTESUR NEGATIVE 04/09/2020 1851         Time coordinating discharge: Over 45 minutes  SIGNED: Deatra James, MD, FACP, FHM. [facility],  Please use [email or web address] to Page If 7PM-7AM, please contact night-coverage [email or web address] Ota [facility] 04/13/2020, 10:58 AM

## 2020-04-14 LAB — CULTURE, BLOOD (ROUTINE X 2)
Culture: NO GROWTH
Culture: NO GROWTH
Special Requests: ADEQUATE
Special Requests: ADEQUATE

## 2020-04-23 ENCOUNTER — Emergency Department (HOSPITAL_COMMUNITY): Payer: Medicaid - Out of State

## 2020-04-23 ENCOUNTER — Other Ambulatory Visit: Payer: Self-pay

## 2020-04-23 ENCOUNTER — Encounter (HOSPITAL_COMMUNITY): Payer: Self-pay | Admitting: Emergency Medicine

## 2020-04-23 ENCOUNTER — Emergency Department (HOSPITAL_COMMUNITY)
Admission: EM | Admit: 2020-04-23 | Discharge: 2020-04-23 | Disposition: A | Discharge status: Home / Self Care | Payer: Medicaid - Out of State | Attending: Emergency Medicine | Admitting: Emergency Medicine

## 2020-04-23 DIAGNOSIS — R000 Tachycardia, unspecified: Secondary | ICD-10-CM | POA: Insufficient documentation

## 2020-04-23 DIAGNOSIS — F17210 Nicotine dependence, cigarettes, uncomplicated: Secondary | ICD-10-CM | POA: Diagnosis not present

## 2020-04-23 DIAGNOSIS — Z79899 Other long term (current) drug therapy: Secondary | ICD-10-CM | POA: Insufficient documentation

## 2020-04-23 DIAGNOSIS — D72829 Elevated white blood cell count, unspecified: Secondary | ICD-10-CM | POA: Insufficient documentation

## 2020-04-23 DIAGNOSIS — I10 Essential (primary) hypertension: Secondary | ICD-10-CM | POA: Insufficient documentation

## 2020-04-23 DIAGNOSIS — Z20822 Contact with and (suspected) exposure to covid-19: Secondary | ICD-10-CM | POA: Insufficient documentation

## 2020-04-23 DIAGNOSIS — R059 Cough, unspecified: Secondary | ICD-10-CM

## 2020-04-23 LAB — D-DIMER, QUANTITATIVE: D-Dimer, Quant: 0.59 ug/mL-FEU — ABNORMAL HIGH (ref 0.00–0.50)

## 2020-04-23 LAB — RESP PANEL BY RT-PCR (FLU A&B, COVID) ARPGX2
Influenza A by PCR: NEGATIVE
Influenza B by PCR: NEGATIVE
SARS Coronavirus 2 by RT PCR: NEGATIVE

## 2020-04-23 LAB — CBC WITH DIFFERENTIAL/PLATELET
Abs Immature Granulocytes: 0.53 10*3/uL — ABNORMAL HIGH (ref 0.00–0.07)
Basophils Absolute: 0.1 10*3/uL (ref 0.0–0.1)
Basophils Relative: 1 %
Eosinophils Absolute: 0 10*3/uL (ref 0.0–0.5)
Eosinophils Relative: 0 %
HCT: 36.1 % (ref 36.0–46.0)
Hemoglobin: 11.9 g/dL — ABNORMAL LOW (ref 12.0–15.0)
Immature Granulocytes: 4 %
Lymphocytes Relative: 16 %
Lymphs Abs: 2 10*3/uL (ref 0.7–4.0)
MCH: 32.1 pg (ref 26.0–34.0)
MCHC: 33 g/dL (ref 30.0–36.0)
MCV: 97.3 fL (ref 80.0–100.0)
Monocytes Absolute: 1.3 10*3/uL — ABNORMAL HIGH (ref 0.1–1.0)
Monocytes Relative: 11 %
Neutro Abs: 8.5 10*3/uL — ABNORMAL HIGH (ref 1.7–7.7)
Neutrophils Relative %: 68 %
Platelets: 422 10*3/uL — ABNORMAL HIGH (ref 150–400)
RBC: 3.71 MIL/uL — ABNORMAL LOW (ref 3.87–5.11)
RDW: 13.5 % (ref 11.5–15.5)
WBC: 12.5 10*3/uL — ABNORMAL HIGH (ref 4.0–10.5)
nRBC: 0 % (ref 0.0–0.2)

## 2020-04-23 LAB — URINALYSIS, ROUTINE W REFLEX MICROSCOPIC
Bilirubin Urine: NEGATIVE
Glucose, UA: NEGATIVE mg/dL
Hgb urine dipstick: NEGATIVE
Ketones, ur: NEGATIVE mg/dL
Nitrite: NEGATIVE
Protein, ur: NEGATIVE mg/dL
Specific Gravity, Urine: 1.016 (ref 1.005–1.030)
pH: 5 (ref 5.0–8.0)

## 2020-04-23 LAB — COMPREHENSIVE METABOLIC PANEL
ALT: 14 U/L (ref 0–44)
AST: 19 U/L (ref 15–41)
Albumin: 3.2 g/dL — ABNORMAL LOW (ref 3.5–5.0)
Alkaline Phosphatase: 53 U/L (ref 38–126)
Anion gap: 11 (ref 5–15)
BUN: 13 mg/dL (ref 6–20)
CO2: 22 mmol/L (ref 22–32)
Calcium: 9.3 mg/dL (ref 8.9–10.3)
Chloride: 103 mmol/L (ref 98–111)
Creatinine, Ser: 0.8 mg/dL (ref 0.44–1.00)
GFR, Estimated: >60 mL/min (ref >60)
Glucose, Bld: 92 mg/dL (ref 70–99)
Potassium: 3.6 mmol/L (ref 3.5–5.1)
Sodium: 136 mmol/L (ref 135–145)
Total Bilirubin: 0.5 mg/dL (ref 0.3–1.2)
Total Protein: 6.8 g/dL (ref 6.5–8.1)

## 2020-04-23 LAB — I-STAT BETA HCG BLOOD, ED (MC, WL, AP ONLY): I-stat hCG, quantitative: <5 m[IU]/mL (ref <5)

## 2020-04-23 LAB — PROCALCITONIN: Procalcitonin: <0.1 ng/mL

## 2020-04-23 LAB — LACTIC ACID, PLASMA
Lactic Acid, Venous: 4.3 mmol/L (ref 0.5–1.9)
Lactic Acid, Venous: 3.1 mmol/L (ref 0.5–1.9)

## 2020-04-23 MED ORDER — SODIUM CHLORIDE 0.9 % IV BOLUS
30.0000 mL/kg | Freq: Once | INTRAVENOUS | Status: AC
  Administered 2020-04-23: 2250 mL via INTRAVENOUS

## 2020-04-23 MED ORDER — AMOXICILLIN-POT CLAVULANATE 875-125 MG PO TABS: 1.0000 | ORAL_TABLET | Freq: Two times a day (BID) | ORAL | Status: DC

## 2020-04-23 MED ORDER — LORAZEPAM 2 MG/ML IJ SOLN
1.0000 mg | Freq: Once | INTRAMUSCULAR | Status: AC | PRN
  Administered 2020-04-23: 1 mg via INTRAVENOUS
  Filled 2020-04-23: qty 1

## 2020-04-23 MED ORDER — PIPERACILLIN-TAZOBACTAM 3.375 G IVPB 30 MIN
3.3750 g | Freq: Once | INTRAVENOUS | Status: AC
  Administered 2020-04-23: 3.375 g via INTRAVENOUS
  Filled 2020-04-23: qty 50

## 2020-04-23 MED ORDER — IOHEXOL 350 MG/ML SOLN
100.0000 mL | Freq: Once | INTRAVENOUS | Status: AC | PRN
  Administered 2020-04-23: 63 mL via INTRAVENOUS

## 2020-04-23 MED ORDER — LORAZEPAM 1 MG PO TABS
1.0000 mg | ORAL_TABLET | Freq: Once | ORAL | Status: AC
  Administered 2020-04-23: 1 mg via ORAL
  Filled 2020-04-23: qty 1

## 2020-04-23 NOTE — Discharge Instructions (Signed)
Call your primary care doctor or specialist as discussed in the next 2-3 days.   Return immediately back to the ER if:  Your symptoms worsen within the next 12-24 hours. You develop new symptoms such as new fevers, persistent vomiting, new pain, shortness of breath, or new weakness or numbness, or if you have any other concerns.  

## 2020-04-23 NOTE — ED Notes (Signed)
Patient is requesting IV ativan vs PO ativan. Patient is willing to try PO. MD aware

## 2020-04-23 NOTE — ED Notes (Signed)
Pt acuity increased to level 2 due to lactic acid 4.3

## 2020-04-23 NOTE — ED Triage Notes (Addendum)
Patient states she got her WBCs re-checked as advised per the doctor, 5 days after discharge for pneumonia. Got a call today that her re-check yesterday was high and she should come to the ED. Pt also endorses SOB x3 days, coughing up dark brown sputum. Also has had a fever the past few days, high of 101.

## 2020-04-23 NOTE — ED Provider Notes (Signed)
[facility]-EMERGENCY DEPT Provider Note   CSN: [id number] Arrival date & time: 04/23/20  1018     History Chief Complaint  Patient presents with  . Cough  . elevated WBCs    Lindsay Miranda is a 30 y.o. female.  Patient presents with cough and elevated white count.  She states that she recently had a diagnosis of pneumonia a few weeks ago and was hospitalized.  Had been doing much better in the last few days.  However recently has had a recurrent cough for about 3 days subjective fevers at home.  Use was seen by an outside physician and was told she had an elevated white count.  She does not know what the number was, she was advised to go to the ER.  Patient did not have any abdominal pain or vomiting or diarrhea complaining of some intermittent cough for the past few days.        Past Medical History:  Diagnosis Date  . Anxiety   . Bipolar 1 disorder, depressed (HCC)   . Heroin abuse (HCC)   . Hypertension   . Renal disorder     Patient Active Problem List   Diagnosis Date Noted  . Severe sepsis (HCC) 04/10/2020  . Chest pain 04/10/2020  . Tobacco use 04/10/2020  . Pneumonia 04/09/2020  . Withdrawal from benzodiazepine (HCC) 03/26/2020  . Polysubstance dependence including opioid type drug with complication, episodic abuse, with unspecified complication (HCC)   . MDD (major depressive disorder), recurrent severe, without psychosis (HCC)   . 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 KIDNEY SURGERY Right 2016     OB History    Gravida  1   Para      Term      Preterm      AB      Living        SAB      IAB      Ectopic      Multiple      Live Births              No family history on file.  Social History   Tobacco Use  . Smoking status: Current Every Day Smoker    Packs/day: 1.00    Types: Cigarettes  . Smokeless tobacco: Never Used  Substance Use Topics  .  Alcohol use: Not Currently  . Drug use: Yes    Types: Benzodiazepines, Amphetamines    Home Medications Prior to Admission medications   Medication Sig Start Date End Date Taking? Authorizing Provider  acetaminophen (TYLENOL) 325 MG tablet Take 650 mg by mouth every 6 (six) hours as needed for mild pain, fever or headache.   Yes [provider]  albuterol (VENTOLIN HFA) 108 (90 Base) MCG/ACT inhaler Inhale 2 puffs into the lungs every 4 (four) hours as needed for wheezing or shortness of breath.   Yes [provider]  amoxicillin-clavulanate (AUGMENTIN) 875-125 MG tablet Take 1 tablet by mouth 2 (two) times daily.   Yes [provider]  amphetamine-dextroamphetamine (ADDERALL) 20 MG tablet Take 20 mg by mouth 2 (two) times daily.   Yes [provider]  buprenorphine (SUBUTEX) 8 MG SUBL SL tablet Place 8 mg under the tongue 3 (three) times daily.   Yes [provider]  clonazePAM (KLONOPIN) 1 MG tablet Take 1 tablet (1 mg total) by mouth 3 (three) times daily as needed for  anxiety. Patient taking differently: Take 2 mg by mouth 3 (three) times daily as needed for anxiety. 03/28/20  Yes Erick Blinks, MD  cyclobenzaprine (FLEXERIL) 5 MG tablet Take 5 mg by mouth at bedtime.   Yes [provider]  gabapentin (NEURONTIN) 800 MG tablet Take 1 tablet (800 mg total) by mouth 4 (four) times daily. 03/28/20  Yes Erick Blinks, MD  methylPREDNISolone (MEDROL DOSEPAK) 4 MG TBPK tablet Medrol Dosepak take as instructed 04/13/20  Yes Shahmehdi, Seyed A, MD  Multiple Vitamins-Minerals (AIRBORNE GUMMIES PO) Take 1 tablet by mouth daily.   Yes [provider]  Prenatal Vit-Fe Fumarate-FA (PRENATAL PO) Take 1 tablet by mouth daily.   Yes [provider]  QUEtiapine (SEROQUEL) 50 MG tablet Take 1 tablet (50 mg total) by mouth at bedtime. 03/28/20  Yes Erick Blinks, MD  sertraline (ZOLOFT) 100 MG tablet Take 1 tablet (100 mg total) by mouth in  the morning. Take with 50mg  tablet every morning to equal 150mg  Patient taking differently: Take 100 mg by mouth in the morning. Takes w/ 50 to total 150mg  03/28/20  Yes , MD  sertraline (ZOLOFT) 50 MG tablet Take 1 tablet (50 mg total) by mouth in the morning. Take with 100mg  tablet every morning to equal 150mg  Patient taking differently: Take 50 mg by mouth in the morning. Take 50mg  w/ 100mg  to total 150mg  03/28/20  Yes Memon, 05/26/20, MD  zolpidem (AMBIEN) 5 MG tablet Take 5 mg by mouth at bedtime.   Yes [provider]  acidophilus (RISAQUAD) CAPS capsule Take 2 capsules by mouth 3 (three) times daily for 10 days. 04/13/20 04/23/20  Shahmehdi, , MD  nicotine (NICODERM CQ - DOSED IN MG/24 HOURS) 14 mg/24hr patch Place 1 patch (14 mg total) onto the skin daily for 28 days. Patient not taking: Reported on 04/23/2020 04/14/20 05/12/20  05/26/20, MD    Allergies    Risperdal [risperidone] and Suboxone [buprenorphine hcl-naloxone hcl]  Review of Systems   Review of Systems  Constitutional: Negative for fever.  HENT: Negative for ear pain.   Eyes: Negative for pain.  Respiratory: Positive for cough.   Cardiovascular: Negative for chest pain.  Gastrointestinal: Negative for abdominal pain.  Genitourinary: Negative for flank pain.  Musculoskeletal: Negative for back pain.  Skin: Negative for rash.  Neurological: Negative for headaches.    Physical Exam Updated Vital Signs BP 130/84   Pulse 85   Temp 98.2 F (36.8 C) (Oral)   Resp 18   Ht 5\' 5"  (1.651 m)   Wt 75 kg   LMP 04/09/2020   SpO2 97%   BMI 27.51 kg/m   Physical Exam Constitutional:      General: She is not in acute distress.    Appearance: Normal appearance.  HENT:     Head: Normocephalic.     Nose: Nose normal.  Eyes:     Extraocular Movements: Extraocular movements intact.  Cardiovascular:     Rate and Rhythm: Tachycardia present.  Pulmonary:     Effort: Pulmonary effort is  normal.     Breath sounds: No wheezing or rhonchi.  Musculoskeletal:        General: Normal range of motion.     Cervical back: Normal range of motion.  Neurological:     General: No focal deficit present.     Mental Status: She is alert. Mental status is at baseline.     ED Results / Procedures / Treatments   Labs (  all labs ordered are listed, but only abnormal results are displayed) Labs Reviewed  LACTIC ACID, PLASMA - Abnormal; Notable for the following components:      Result Value   Lactic Acid, Venous 4.3 (*)    All other components within normal limits  LACTIC ACID, PLASMA - Abnormal; Notable for the following components:   Lactic Acid, Venous 3.1 (*)    All other components within normal limits  CBC WITH DIFFERENTIAL/PLATELET - Abnormal; Notable for the following components:   WBC 12.5 (*)    RBC 3.71 (*)    Hemoglobin 11.9 (*)    Platelets 422 (*)    Neutro Abs 8.5 (*)    Monocytes Absolute 1.3 (*)    Abs Immature Granulocytes 0.53 (*)    All other components within normal limits  COMPREHENSIVE METABOLIC PANEL - Abnormal; Notable for the following components:   Albumin 3.2 (*)    All other components within normal limits  D-DIMER, QUANTITATIVE - Abnormal; Notable for the following components:   D-Dimer, Quant 0.59 (*)    All other components within normal limits  URINALYSIS, ROUTINE W REFLEX MICROSCOPIC - Abnormal; Notable for the following components:   APPearance HAZY (*)    Leukocytes,Ua TRACE (*)    Bacteria, UA RARE (*)    All other components within normal limits  RESP PANEL BY RT-PCR (FLU A&B, COVID) ARPGX2  CULTURE, BLOOD (ROUTINE X 2)  CULTURE, BLOOD (ROUTINE X 2)  URINE CULTURE  PROCALCITONIN  I-STAT BETA HCG BLOOD, ED ([facility], [facility], [facility] ONLY)    EKG None  Radiology CT ANGIO CHEST PE W OR WO CONTRAST  Result Date: 04/23/2020 CLINICAL DATA:  Pneumonia.  Cough and shortness of breath. EXAM: CT ANGIOGRAPHY CHEST WITH CONTRAST TECHNIQUE: Multidetector CT  imaging of the chest was performed using the standard protocol during bolus administration of intravenous contrast. Multiplanar CT image reconstructions and MIPs were obtained to evaluate the vascular anatomy. CONTRAST:  63mL OMNIPAQUE IOHEXOL 350 MG/ML SOLN COMPARISON:  Chest radiography earlier today. Previous CT angiogram 04/09/2020 FINDINGS: Cardiovascular: Heart size upper limits of normal. No visible coronary artery calcification or aortic atherosclerotic calcification. No pulmonary emboli. Mediastinum/Nodes: No mass or lymphadenopathy. Lungs/Pleura: Patchy pulmonary infiltrates in the right lung have almost completely resolved. There is considerable improvement in the left lung pneumonia, but with some persistent patchy infiltrates as well as an area of cavitation at the epicenter of the previously seen dense left lower lobe pneumonia and in the upper lingula. No pleural effusion. No new area of involvement that was not involved previously. Upper Abdomen: Normal Musculoskeletal: Normal Review of the MIP images confirms the above findings. IMPRESSION: 1. No pulmonary emboli. 2. Patchy pulmonary infiltrates in the right lung have almost completely resolved. 3. There is considerable improvement in the left lung pneumonia, but with some persistent patchy infiltrates as well as 2 areas of cavitation at the epicenter of the previously seen dense left lower lobe pneumonia and in the upper lingula. Electronically Signed   By: Paulina FusiMark  Shogry M.D.   On: 04/23/2020 15:08   DG Chest Port 1 View  Result Date: 04/23/2020 CLINICAL DATA:  Cough and elevated white blood cell count EXAM: PORTABLE CHEST 1 VIEW COMPARISON:  04/09/2020 FINDINGS: Cardiac shadow is within normal limits. The lungs are well aerated bilaterally. Patchy basilar infiltrate is noted on the left but significantly improved when compared with the prior exam. No effusion is seen. No bony abnormality is noted. IMPRESSION: Patchy infiltrate in the left base  significantly improved when compared with the prior exam. Electronically Signed   By: Alcide Clever M.D.   On: 04/23/2020 11:30    Procedures Procedures   Medications Ordered in ED Medications  LORazepam (ATIVAN) tablet 1 mg (1 mg Oral Given 04/23/20 1123)  LORazepam (ATIVAN) injection 1 mg (1 mg Intravenous Given 04/23/20 1209)  sodium chloride 0.9 % bolus 2,250 mL (2,250 mLs Intravenous New Bag/Given 04/23/20 1205)  piperacillin-tazobactam (ZOSYN) IVPB 3.375 g (0 g Intravenous Stopped 04/23/20 1227)  iohexol (OMNIPAQUE) 350 MG/ML injection 100 mL (63 mLs Intravenous Contrast Given 04/23/20 1431)    ED Course  I have reviewed the triage vital signs and the nursing notes.  Pertinent labs & imaging results that were available during my care of the patient were reviewed by me and considered in my medical decision making (see chart for details).    MDM Rules/Calculators/A&P                          Patient initially tachycardic.  However O2 saturation 99% room air which is appropriate.  Given her tachycardia, lactic acid was sent and elevated at 4 however blood pressure appears stable.  Concern for possible sepsis, culture sent and broad-spectrum antibiotic started.  However procalcitonin is undetectable, lactic acid appears improved with fluid hydration white count is only 12.  Vital signs improved as well tachycardia is resolved.  No episodes of hypotension or hypoxemia.  Imaging also shows improvement of pneumonia states it is nearly gone.  I did consider sepsis given elevated lactic acidosis, however if you perhaps this is secondary to dehydration or other causes rather than sepsis.  Given Augmentin for coverage, advised follow-up with a doctor again in 3 to 4 days.  Advised immediate return for worsening symptoms trouble breathing or any additional concerns.   Final Clinical Impression(s) / ED Diagnoses Final diagnoses:  Cough    Rx / DC Orders ED Discharge Orders    None       Cheryll Cockayne, MD 04/23/20 [phone]

## 2020-04-23 NOTE — ED Notes (Signed)
Med tech going in room to complete med rec per patient request

## 2020-04-23 NOTE — ED Notes (Signed)
Pt complaining of nervousness and requesting something for anxiety. Patient states she feels like she cant breath. PRN medication adminstered

## 2020-04-24 ENCOUNTER — Other Ambulatory Visit: Payer: Self-pay

## 2020-04-24 ENCOUNTER — Emergency Department (HOSPITAL_BASED_OUTPATIENT_CLINIC_OR_DEPARTMENT_OTHER)
Admission: EM | Admit: 2020-04-24 | Discharge: 2020-04-24 | Disposition: A | Discharge status: Home / Self Care | Payer: Medicaid - Out of State | Attending: Emergency Medicine | Admitting: Emergency Medicine

## 2020-04-24 ENCOUNTER — Emergency Department (HOSPITAL_BASED_OUTPATIENT_CLINIC_OR_DEPARTMENT_OTHER): Payer: Medicaid - Out of State

## 2020-04-24 ENCOUNTER — Encounter (HOSPITAL_BASED_OUTPATIENT_CLINIC_OR_DEPARTMENT_OTHER): Payer: Self-pay | Admitting: *Deleted

## 2020-04-24 DIAGNOSIS — Z79899 Other long term (current) drug therapy: Secondary | ICD-10-CM | POA: Insufficient documentation

## 2020-04-24 DIAGNOSIS — R0602 Shortness of breath: Secondary | ICD-10-CM | POA: Insufficient documentation

## 2020-04-24 DIAGNOSIS — F419 Anxiety disorder, unspecified: Secondary | ICD-10-CM | POA: Insufficient documentation

## 2020-04-24 DIAGNOSIS — F17210 Nicotine dependence, cigarettes, uncomplicated: Secondary | ICD-10-CM | POA: Insufficient documentation

## 2020-04-24 DIAGNOSIS — I10 Essential (primary) hypertension: Secondary | ICD-10-CM | POA: Diagnosis not present

## 2020-04-24 DIAGNOSIS — R079 Chest pain, unspecified: Secondary | ICD-10-CM | POA: Insufficient documentation

## 2020-04-24 LAB — URINALYSIS, ROUTINE W REFLEX MICROSCOPIC
Bilirubin Urine: NEGATIVE
Glucose, UA: NEGATIVE mg/dL
Hgb urine dipstick: NEGATIVE
Ketones, ur: NEGATIVE mg/dL
Leukocytes,Ua: NEGATIVE
Nitrite: NEGATIVE
Protein, ur: NEGATIVE mg/dL
Specific Gravity, Urine: 1.02 (ref 1.005–1.030)
pH: 6.5 (ref 5.0–8.0)

## 2020-04-24 LAB — TROPONIN I (HIGH SENSITIVITY): Troponin I (High Sensitivity): 5 ng/L (ref <18)

## 2020-04-24 LAB — BASIC METABOLIC PANEL
Anion gap: 15 (ref 5–15)
BUN: 10 mg/dL (ref 6–20)
CO2: 23 mmol/L (ref 22–32)
Calcium: 9.1 mg/dL (ref 8.9–10.3)
Chloride: 100 mmol/L (ref 98–111)
Creatinine, Ser: 0.86 mg/dL (ref 0.44–1.00)
GFR, Estimated: >60 mL/min (ref >60)
Glucose, Bld: 93 mg/dL (ref 70–99)
Potassium: 3.7 mmol/L (ref 3.5–5.1)
Sodium: 138 mmol/L (ref 135–145)

## 2020-04-24 LAB — CBC
HCT: 35.4 % — ABNORMAL LOW (ref 36.0–46.0)
Hemoglobin: 12.1 g/dL (ref 12.0–15.0)
MCH: 32.7 pg (ref 26.0–34.0)
MCHC: 34.2 g/dL (ref 30.0–36.0)
MCV: 95.7 fL (ref 80.0–100.0)
Platelets: 458 10*3/uL — ABNORMAL HIGH (ref 150–400)
RBC: 3.7 MIL/uL — ABNORMAL LOW (ref 3.87–5.11)
RDW: 13.6 % (ref 11.5–15.5)
WBC: 14.5 10*3/uL — ABNORMAL HIGH (ref 4.0–10.5)
nRBC: 0 % (ref 0.0–0.2)

## 2020-04-24 LAB — URINE CULTURE: Culture: <10000 — AB

## 2020-04-24 LAB — PREGNANCY, URINE: Preg Test, Ur: NEGATIVE

## 2020-04-24 MED ORDER — FENTANYL CITRATE (PF) 100 MCG/2ML IJ SOLN
50.0000 ug | Freq: Once | INTRAMUSCULAR | Status: AC
  Administered 2020-04-24: 50 ug via INTRAVENOUS
  Filled 2020-04-24: qty 2

## 2020-04-24 MED ORDER — LORAZEPAM 2 MG/ML IJ SOLN
0.5000 mg | Freq: Once | INTRAMUSCULAR | Status: AC
  Administered 2020-04-24: 0.5 mg via INTRAVENOUS
  Filled 2020-04-24: qty 1

## 2020-04-24 MED ORDER — LORAZEPAM 2 MG/ML IJ SOLN
1.0000 mg | Freq: Once | INTRAMUSCULAR | Status: AC
  Administered 2020-04-24: 1 mg via INTRAVENOUS
  Filled 2020-04-24: qty 1

## 2020-04-24 MED ORDER — SODIUM CHLORIDE 0.9 % IV BOLUS
1000.0000 mL | Freq: Once | INTRAVENOUS | Status: AC
  Administered 2020-04-24: 1000 mL via INTRAVENOUS

## 2020-04-24 MED ORDER — CLONAZEPAM 1 MG PO TABS: 1.0000 mg | ORAL_TABLET | Freq: Three times a day (TID) | ORAL | 0 refills | Status: AC | PRN

## 2020-04-24 NOTE — ED Triage Notes (Signed)
Pt was admitted to ICU at [facility] for respiratory failure. in Feb. Since then she she has not felt well. Reports over the last 3-4 days she has felt lightheaded, SOB, shaky, anxiety. HR 130s

## 2020-04-24 NOTE — Discharge Instructions (Addendum)
Strongly recommend follow-up with your primary care doctor.  If you have worsening chest pain, difficulty in breathing or other new concerning symptom, return to the emergency room for reassessment.

## 2020-04-24 NOTE — ED Provider Notes (Signed)
[facility] EMERGENCY DEPARTMENT Provider Note   CSN: [id number] Arrival date & time: 04/24/20  1910     History Chief Complaint  Patient presents with  . Chest Pain    Lindsay Miranda is a 30 y.o. female.  Presents to ER with concern for myriad complaints.  Patient reports that ever since she was in the hospital she has been struggling with persistent shortness of breath and chest pain.  Also having severe anxiety.  No thoughts of hurting herself or others.  States that over the last few days this seems to have gotten worse.  Symptoms similar to her symptoms that she was having yesterday.  Patient reports that she was recently put on a course of antibiotics and steroids for possible recurrent or persistent pneumonia.  In ER yesterday, she was concern for fever and cough as well as chest pain and difficulty breathing.  Extensive work-up noted for elevation lactic acid, leukocytosis, but procalcitonin was undetectable which would suggest patient does not have a new acute bacterial infectious process.  D-dimer was elevated and CT PE study was completed which was negative for PE.  Demonstrated for prior pulmonary infiltrates had almost completely resolved but she does have some persistent patchy infiltrates on her left side.  Patient was recommended to continue the antibiotics and follow-up with her primary doctor.  Patient denies any new fevers today.  HPI     Past Medical History:  Diagnosis Date  . Anxiety   . Bipolar 1 disorder, depressed ([place])   . Heroin abuse ([place])   . Hypertension   . Renal disorder     Patient Active Problem List   Diagnosis Date Noted  . Severe sepsis ([place]) 04/10/2020  . Chest pain 04/10/2020  . Tobacco use 04/10/2020  . Pneumonia 04/09/2020  . Withdrawal from benzodiazepine ([place]) 03/26/2020  . Polysubstance dependence including opioid type drug with complication, episodic abuse, with unspecified complication ([place])   . MDD (major depressive  disorder), recurrent severe, without psychosis ([place])   . 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 KIDNEY SURGERY Right 2016     OB History    Gravida  1   Para      Term      Preterm      AB      Living        SAB      IAB      Ectopic      Multiple      Live Births              No family history on file.  Social History   Tobacco Use  . Smoking status: Current Every Day Smoker    Packs/day: 1.00    Types: Cigarettes  . Smokeless tobacco: Never Used  Substance Use Topics  . Alcohol use: Not Currently  . Drug use: Yes    Types: Benzodiazepines, Amphetamines    Home Medications Prior to Admission medications   Medication Sig Start Date End Date Taking? Authorizing Provider  acetaminophen (TYLENOL) 325 MG tablet Take 650 mg by mouth every 6 (six) hours as needed for mild pain, fever or headache.    [provider]  albuterol (VENTOLIN HFA) 108 (90 Base) MCG/ACT inhaler Inhale 2 puffs into the lungs every 4 (four) hours as needed for wheezing or shortness of breath.    [provider]  amoxicillin-clavulanate (AUGMENTIN) 875-125 MG tablet Take  1 tablet by mouth 2 (two) times daily.    [provider]  amphetamine-dextroamphetamine (ADDERALL) 20 MG tablet Take 20 mg by mouth 2 (two) times daily.    [provider]  buprenorphine (SUBUTEX) 8 MG SUBL SL tablet Place 8 mg under the tongue 3 (three) times daily.    [provider]  clonazePAM (KLONOPIN) 1 MG tablet Take 1 tablet (1 mg total) by mouth 3 (three) times daily as needed for anxiety. 04/24/20   Lucrezia Starch, MD  cyclobenzaprine (FLEXERIL) 5 MG tablet Take 5 mg by mouth at bedtime.    [provider]  gabapentin (NEURONTIN) 800 MG tablet Take 1 tablet (800 mg total) by mouth 4 (four) times daily. 03/28/20   Kathie Dike, MD  methylPREDNISolone (MEDROL DOSEPAK) 4 MG TBPK tablet Medrol Dosepak  take as instructed 04/13/20   Shahmehdi, Valeria Batman, MD  Multiple Vitamins-Minerals (AIRBORNE GUMMIES PO) Take 1 tablet by mouth daily.    [provider]  nicotine (NICODERM CQ - DOSED IN MG/24 HOURS) 14 mg/24hr patch Place 1 patch (14 mg total) onto the skin daily for 28 days. Patient not taking: Reported on 04/23/2020 04/14/20 05/12/20  Deatra James, MD  Prenatal Vit-Fe Fumarate-FA (PRENATAL PO) Take 1 tablet by mouth daily.    [provider]  QUEtiapine (SEROQUEL) 50 MG tablet Take 1 tablet (50 mg total) by mouth at bedtime. 03/28/20   Kathie Dike, MD  sertraline (ZOLOFT) 100 MG tablet Take 1 tablet (100 mg total) by mouth in the morning. Take with 79m tablet every morning to equal 1535mPatient taking differently: Take 100 mg by mouth in the morning. Takes w/ 50 to total 15053m/6/22   MemKathie DikeD  sertraline (ZOLOFT) 50 MG tablet Take 1 tablet (50 mg total) by mouth in the morning. Take with 100m63mblet every morning to equal 150mg69mient taking differently: Take 50 mg by mouth in the morning. Take 50mg 65m00mg t60mtal 150mg 2/28m   Memon, JKathie Dikelpidem (AMBIEN) 5 MG tablet Take 5 mg by mouth at bedtime.    [provider]    Allergies    Risperdal [risperidone] and Suboxone [buprenorphine hcl-naloxone hcl]  Review of Systems   Review of Systems  Constitutional: Positive for chills and fatigue. Negative for fever.  HENT: Negative for ear pain and sore throat.   Eyes: Negative for pain and visual disturbance.  Respiratory: Positive for cough and shortness of breath.   Cardiovascular: Positive for chest pain. Negative for palpitations.  Gastrointestinal: Negative for abdominal pain and vomiting.  Genitourinary: Negative for dysuria and hematuria.  Musculoskeletal: Negative for arthralgias and back pain.  Skin: Negative for color change and rash.  Neurological: Negative for seizures and syncope.  Psychiatric/Behavioral: The patient is  nervous/anxious.   All other systems reviewed and are negative.   Physical Exam Updated Vital Signs BP (!) 144/96   Pulse 99   Temp 99 F (37.2 C) (Oral)   Resp 19   Wt 89.8 kg   LMP 04/09/2020   SpO2 98%   Breastfeeding No   BMI 32.95 kg/m   Physical Exam Vitals and nursing note reviewed.  Constitutional:      General: She is not in acute distress.    Appearance: She is well-developed and well-nourished.  HENT:     Head: Normocephalic and atraumatic.  Eyes:     Conjunctiva/sclera: Conjunctivae normal.  Cardiovascular:     Rate and Rhythm: Normal rate  and regular rhythm.     Heart sounds: No murmur heard.   Pulmonary:     Effort: Pulmonary effort is normal. No respiratory distress.     Breath sounds: Normal breath sounds.  Chest:     Chest wall: No tenderness or crepitus.  Abdominal:     Palpations: Abdomen is soft.     Tenderness: There is no abdominal tenderness.  Musculoskeletal:        General: No edema.     Cervical back: Neck supple.     Right lower leg: No edema.     Left lower leg: No edema.  Skin:    General: Skin is warm and dry.  Neurological:     General: No focal deficit present.     Mental Status: She is alert and oriented to person, place, and time.  Psychiatric:        Mood and Affect: Mood and affect and mood normal.        Behavior: Behavior normal.     ED Results / Procedures / Treatments   Labs (all labs ordered are listed, but only abnormal results are displayed) Labs Reviewed  CBC - Abnormal; Notable for the following components:      Result Value   WBC 14.5 (*)    RBC 3.70 (*)    HCT 35.4 (*)    Platelets 458 (*)    All other components within normal limits  BASIC METABOLIC PANEL  PREGNANCY, URINE  URINALYSIS, ROUTINE W REFLEX MICROSCOPIC  TROPONIN I (HIGH SENSITIVITY)    EKG None  Radiology DG Chest 2 View  Result Date: 04/24/2020 CLINICAL DATA:  Chest pain.  Shortness of breath. EXAM: CHEST - 2 VIEW COMPARISON:   Radiograph and chest CTA yesterday. FINDINGS: No significant change from imaging yesterday. Patchy airspace disease in the left lung base, cavitary components on CT are not well-defined by radiograph. No new consolidation. Normal heart size with stable mediastinal contours. No pleural fluid or pneumothorax. No acute osseous abnormalities are seen. Dermal piercing projects over the thoracic inlet. IMPRESSION: No change from radiographs and CT obtained yesterday. Patchy left lung base airspace disease, cavitary components on CT are not well-defined by radiograph. Electronically Signed   By: Keith Rake M.D.   On: 04/24/2020 20:21   CT ANGIO CHEST PE W OR WO CONTRAST  Result Date: 04/23/2020 CLINICAL DATA:  Pneumonia.  Cough and shortness of breath. EXAM: CT ANGIOGRAPHY CHEST WITH CONTRAST TECHNIQUE: Multidetector CT imaging of the chest was performed using the standard protocol during bolus administration of intravenous contrast. Multiplanar CT image reconstructions and MIPs were obtained to evaluate the vascular anatomy. CONTRAST:  61m OMNIPAQUE IOHEXOL 350 MG/ML SOLN COMPARISON:  Chest radiography earlier today. Previous CT angiogram 04/09/2020 FINDINGS: Cardiovascular: Heart size upper limits of normal. No visible coronary artery calcification or aortic atherosclerotic calcification. No pulmonary emboli. Mediastinum/Nodes: No mass or lymphadenopathy. Lungs/Pleura: Patchy pulmonary infiltrates in the right lung have almost completely resolved. There is considerable improvement in the left lung pneumonia, but with some persistent patchy infiltrates as well as an area of cavitation at the epicenter of the previously seen dense left lower lobe pneumonia and in the upper lingula. No pleural effusion. No new area of involvement that was not involved previously. Upper Abdomen: Normal Musculoskeletal: Normal Review of the MIP images confirms the above findings. IMPRESSION: 1. No pulmonary emboli. 2. Patchy  pulmonary infiltrates in the right lung have almost completely resolved. 3. There is considerable improvement in the left  lung pneumonia, but with some persistent patchy infiltrates as well as 2 areas of cavitation at the epicenter of the previously seen dense left lower lobe pneumonia and in the upper lingula. Electronically Signed   By: Nelson Chimes M.D.   On: 04/23/2020 15:08   DG Chest Port 1 View  Result Date: 04/23/2020 CLINICAL DATA:  Cough and elevated white blood cell count EXAM: PORTABLE CHEST 1 VIEW COMPARISON:  04/09/2020 FINDINGS: Cardiac shadow is within normal limits. The lungs are well aerated bilaterally. Patchy basilar infiltrate is noted on the left but significantly improved when compared with the prior exam. No effusion is seen. No bony abnormality is noted. IMPRESSION: Patchy infiltrate in the left base significantly improved when compared with the prior exam. Electronically Signed   By: Inez Catalina M.D.   On: 04/23/2020 11:30    Procedures Procedures   Medications Ordered in ED Medications  LORazepam (ATIVAN) injection 1 mg (1 mg Intravenous Given 04/24/20 2024)  sodium chloride 0.9 % bolus 1,000 mL ( Intravenous Stopped 04/24/20 2132)  fentaNYL (SUBLIMAZE) injection 50 mcg (50 mcg Intravenous Given 04/24/20 2026)  LORazepam (ATIVAN) injection 0.5 mg (0.5 mg Intravenous Given 04/24/20 2239)    ED Course  I have reviewed the triage vital signs and the nursing notes.  Pertinent labs & imaging results that were available during my care of the patient were reviewed by me and considered in my medical decision making (see chart for details).    MDM Rules/Calculators/A&P                         30 year old lady recent history of admission for severe pneumonia requiring ICU stay presents to ER with concern for chest pain, shortness of breath, anxiety.  On exam, patient appeared anxious, tachycardic but was not in acute distress.  Patient was provided fluids, Ativan, fentanyl and her  symptoms completely resolved and tachycardia resolved.  Repeat chest x-ray unchanged from yesterday.  Regarding possible persistent pneumonia, believe continuing a course of oral antibiotics is reasonable at this time and based on work-up today and yesterday, do not feel she needs additional testing or change in her management plan at present.  EKG showed sinus tachycardia, no acute ischemic change and troponin was within normal limits.  Highly doubt ACS.  Also concern for benzodiazepine dependence.  Patient reports someone stole her clonazepam and requesting additional prescription.  Had lengthy discussion about prescribing controlled substances.  She reports intentions of following up with her primary doctor in the next few days in [place] for refill of her chronic prescription.  Provided a very short Rx of her home clonazepam.  Recommended continuing her prior antibiotics and steroids.  Reviewed return precautions and discharged.   After the discussed management above, the patient was determined to be safe for discharge.  The patient was in agreement with this plan and all questions regarding their care were answered.  ED return precautions were discussed and the patient will return to the ED with any significant worsening of condition.   Final Clinical Impression(s) / ED Diagnoses Final diagnoses:  Chest pain, unspecified type    Rx / DC Orders ED Discharge Orders         Ordered    clonazePAM (KLONOPIN) 1 MG tablet  3 times daily PRN        04/24/20 2249           Lucrezia Starch, MD 04/25/20 1725

## 2020-04-25 ENCOUNTER — Telehealth: Payer: Self-pay | Admitting: Surgery

## 2020-04-25 NOTE — Telephone Encounter (Signed)
ED RNCM received call from [facility] pharmacy with prescription clarification. Clarification provided no further questions or concerns noted. 

## 2020-04-28 LAB — CULTURE, BLOOD (ROUTINE X 2)
Culture: NO GROWTH
Culture: NO GROWTH
Special Requests: ADEQUATE
Special Requests: ADEQUATE

## 2021-06-01 IMAGING — CT CT ANGIO CHEST
3 of 7 series · 18 of 36 positions shown · IV contrast (OMNIPAQUE 350)
Comparison: Chest radiography earlier today. Previous CT angiogram
04/09/2020

CLINICAL DATA: Pneumonia.  Cough and shortness of breath.

EXAM:
CT ANGIOGRAPHY CHEST WITH CONTRAST
TECHNIQUE: Multidetector CT imaging of the chest was performed using the
standard protocol during bolus administration of intravenous
contrast. Multiplanar CT image reconstructions and MIPs were
obtained to evaluate the vascular anatomy.
CONTRAST:  63mL OMNIPAQUE IOHEXOL 350 MG/ML SOLN

[Series 6: thins · axial · 0.76mm/px · z∈[-28,+192]mm · 12 of 259 slices shown]
[im 20/259  lung]
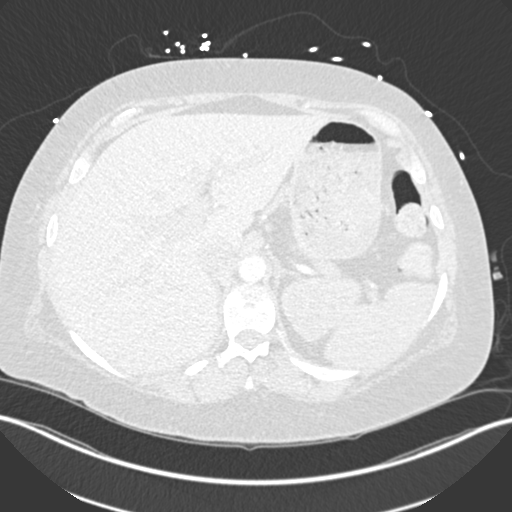
[im 40/259  mediastinal]
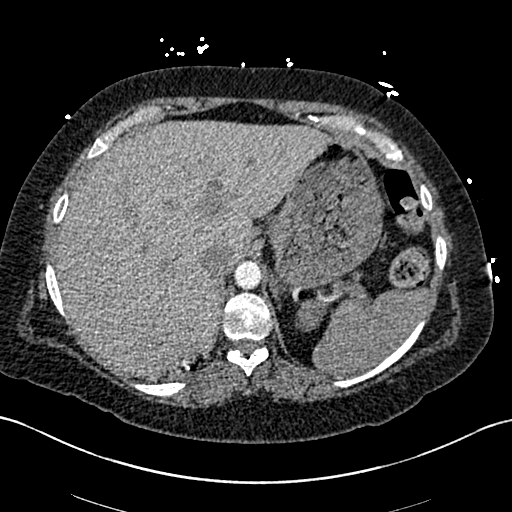
[im 60/259  lung]
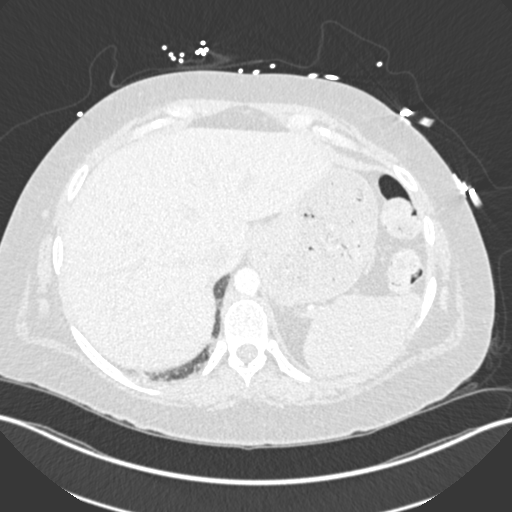
[im 80/259  mediastinal]
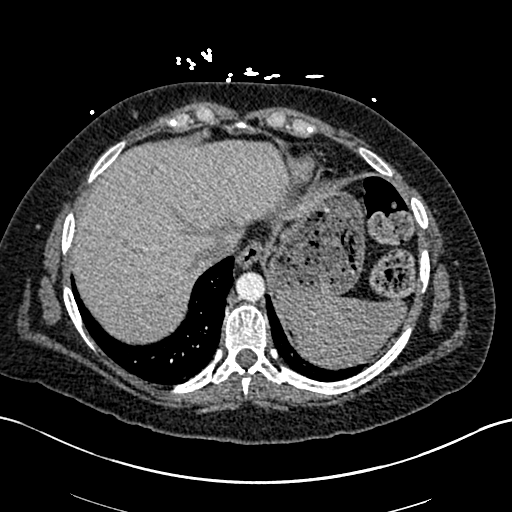
[im 100/259  lung]
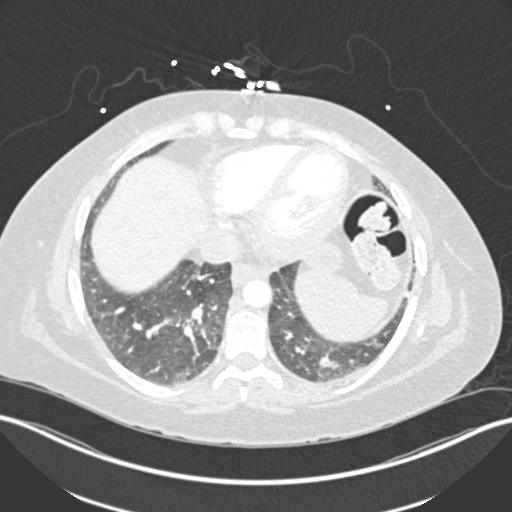
[im 120/259  mediastinal]
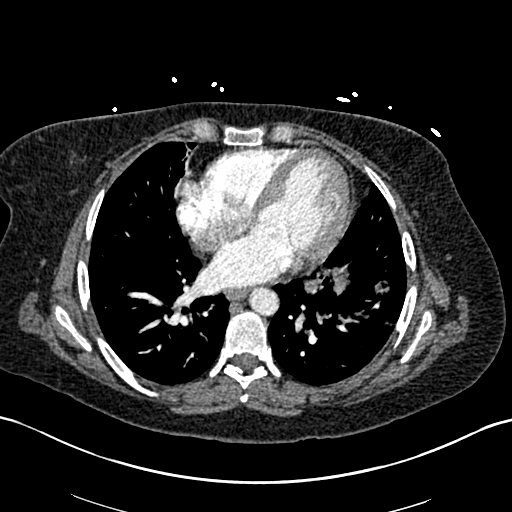
[im 139/259  lung]
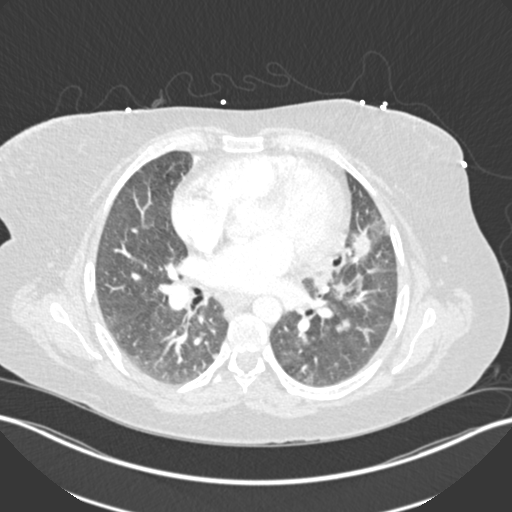
[im 159/259  mediastinal]
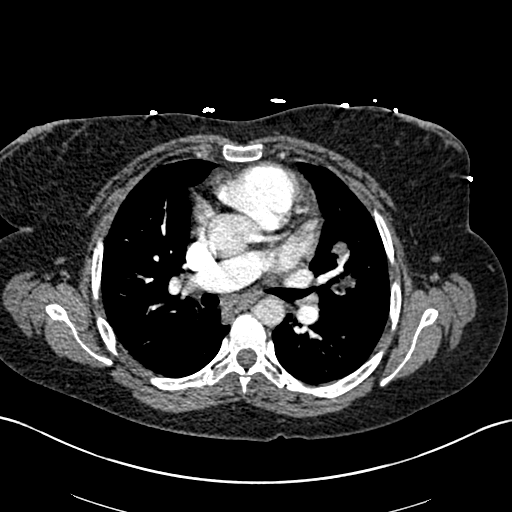
[im 179/259  lung]
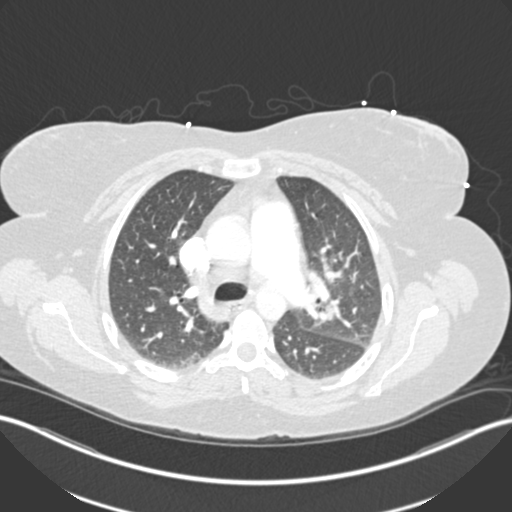
[im 199/259  mediastinal]
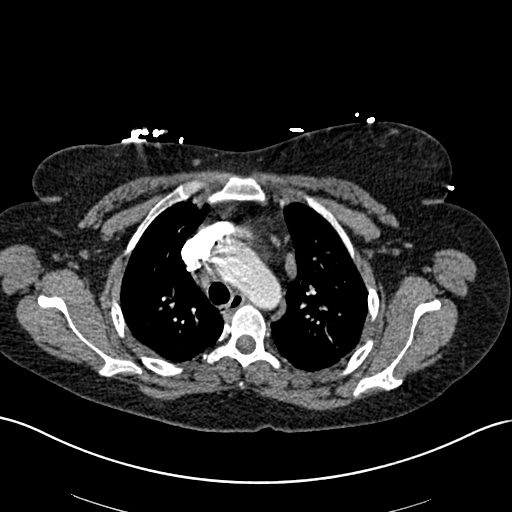
[im 219/259  lung]
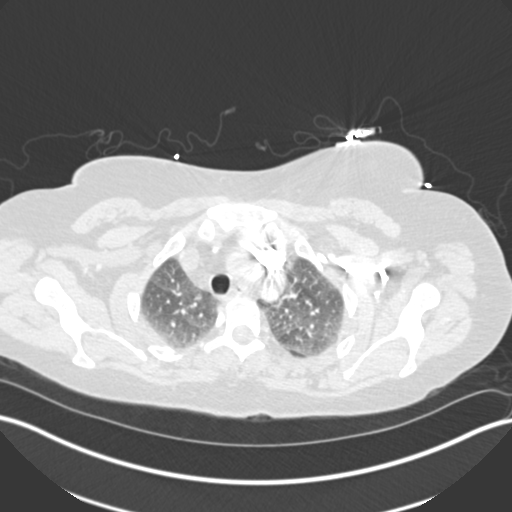
[im 239/259  mediastinal]
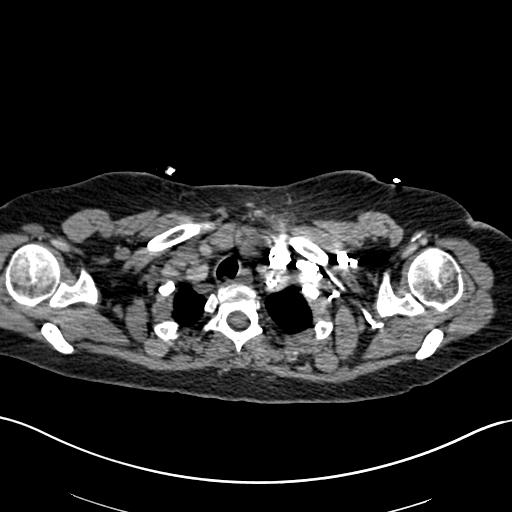

[Series 7: coronal mpr · coronal · 0.59mm/px · 1 of 120 slices shown]
[im 60/120  mediastinal]
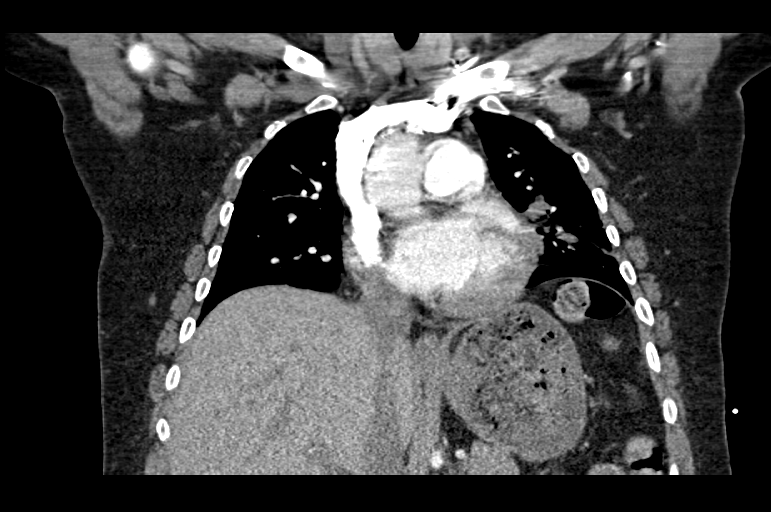

[Series 11: lung · axial · 0.76mm/px · z∈[+6,+164]mm · 5 of 119 slices shown]
[im 20/119  mediastinal]
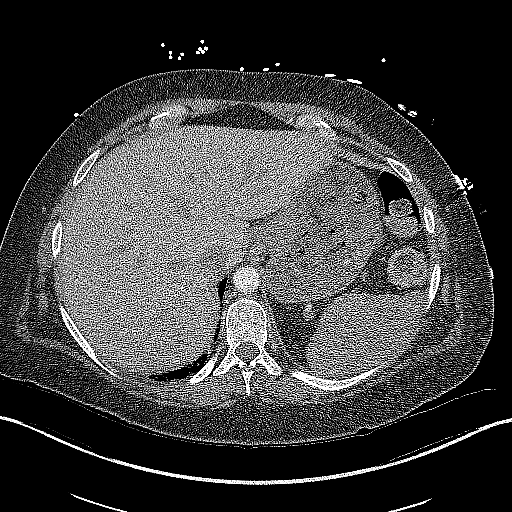
[im 40/119  mediastinal]
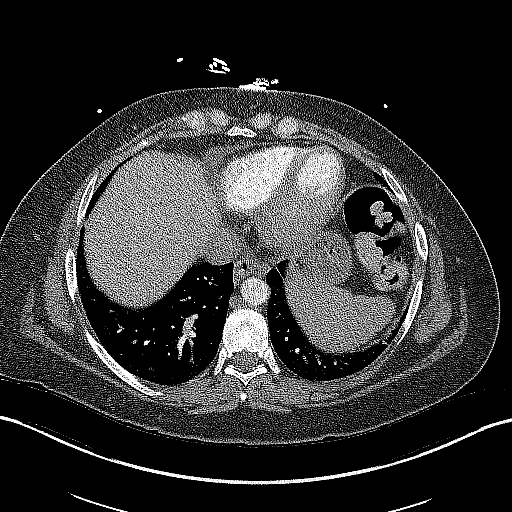
[im 60/119  mediastinal]
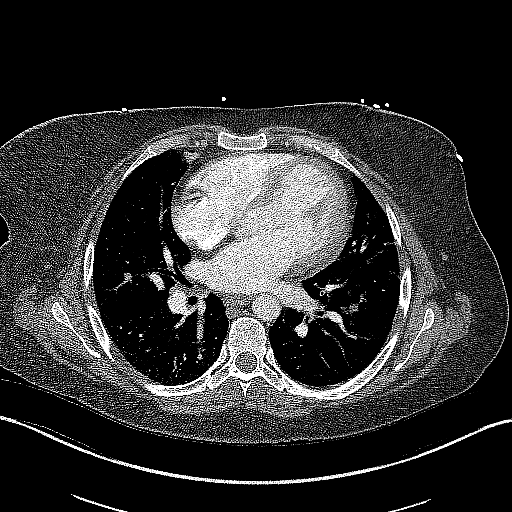
[im 79/119  mediastinal]
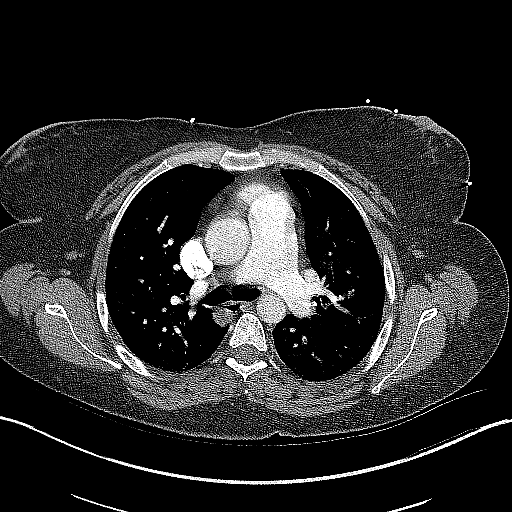
[im 99/119  mediastinal]
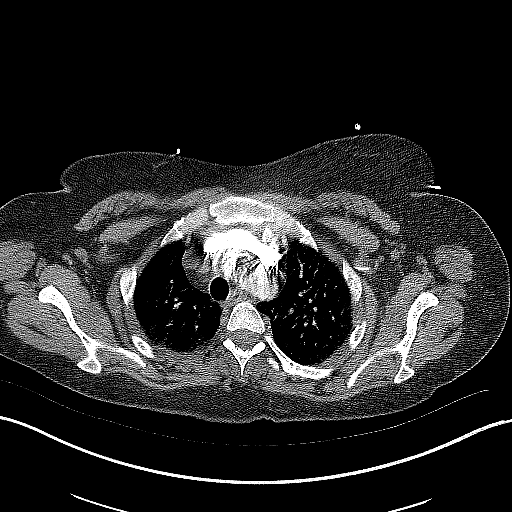

[18 of 36 positions shown; findings below may reference images not displayed]

FINDINGS: Cardiovascular: Heart size upper limits of normal. No visible
coronary artery calcification or aortic atherosclerotic
calcification. No pulmonary emboli.

Mediastinum/Nodes: No mass or lymphadenopathy.

Lungs/Pleura: Patchy pulmonary infiltrates in the right lung have
almost completely resolved. There is considerable improvement in the
left lung pneumonia, but with some persistent patchy infiltrates as
well as an area of cavitation at the epicenter of the previously
seen dense left lower lobe pneumonia and in the upper lingula. No
pleural effusion. No new area of involvement that was not involved
previously.

Upper Abdomen: Normal

Musculoskeletal: Normal

Review of the MIP images confirms the above findings.
IMPRESSION: 1. No pulmonary emboli.
2. Patchy pulmonary infiltrates in the right lung have almost
completely resolved.
3. There is considerable improvement in the left lung pneumonia, but
with some persistent patchy infiltrates as well as 2 areas of
cavitation at the epicenter of the previously seen dense left lower
lobe pneumonia and in the upper lingula.

## 2021-06-01 IMAGING — DX DG CHEST 1V PORT
1 series · 1 of 1 positions shown · non-contrast
Comparison: 04/09/2020

CLINICAL DATA: Cough and elevated white blood cell count

EXAM:
PORTABLE CHEST 1 VIEW

[chest ap]
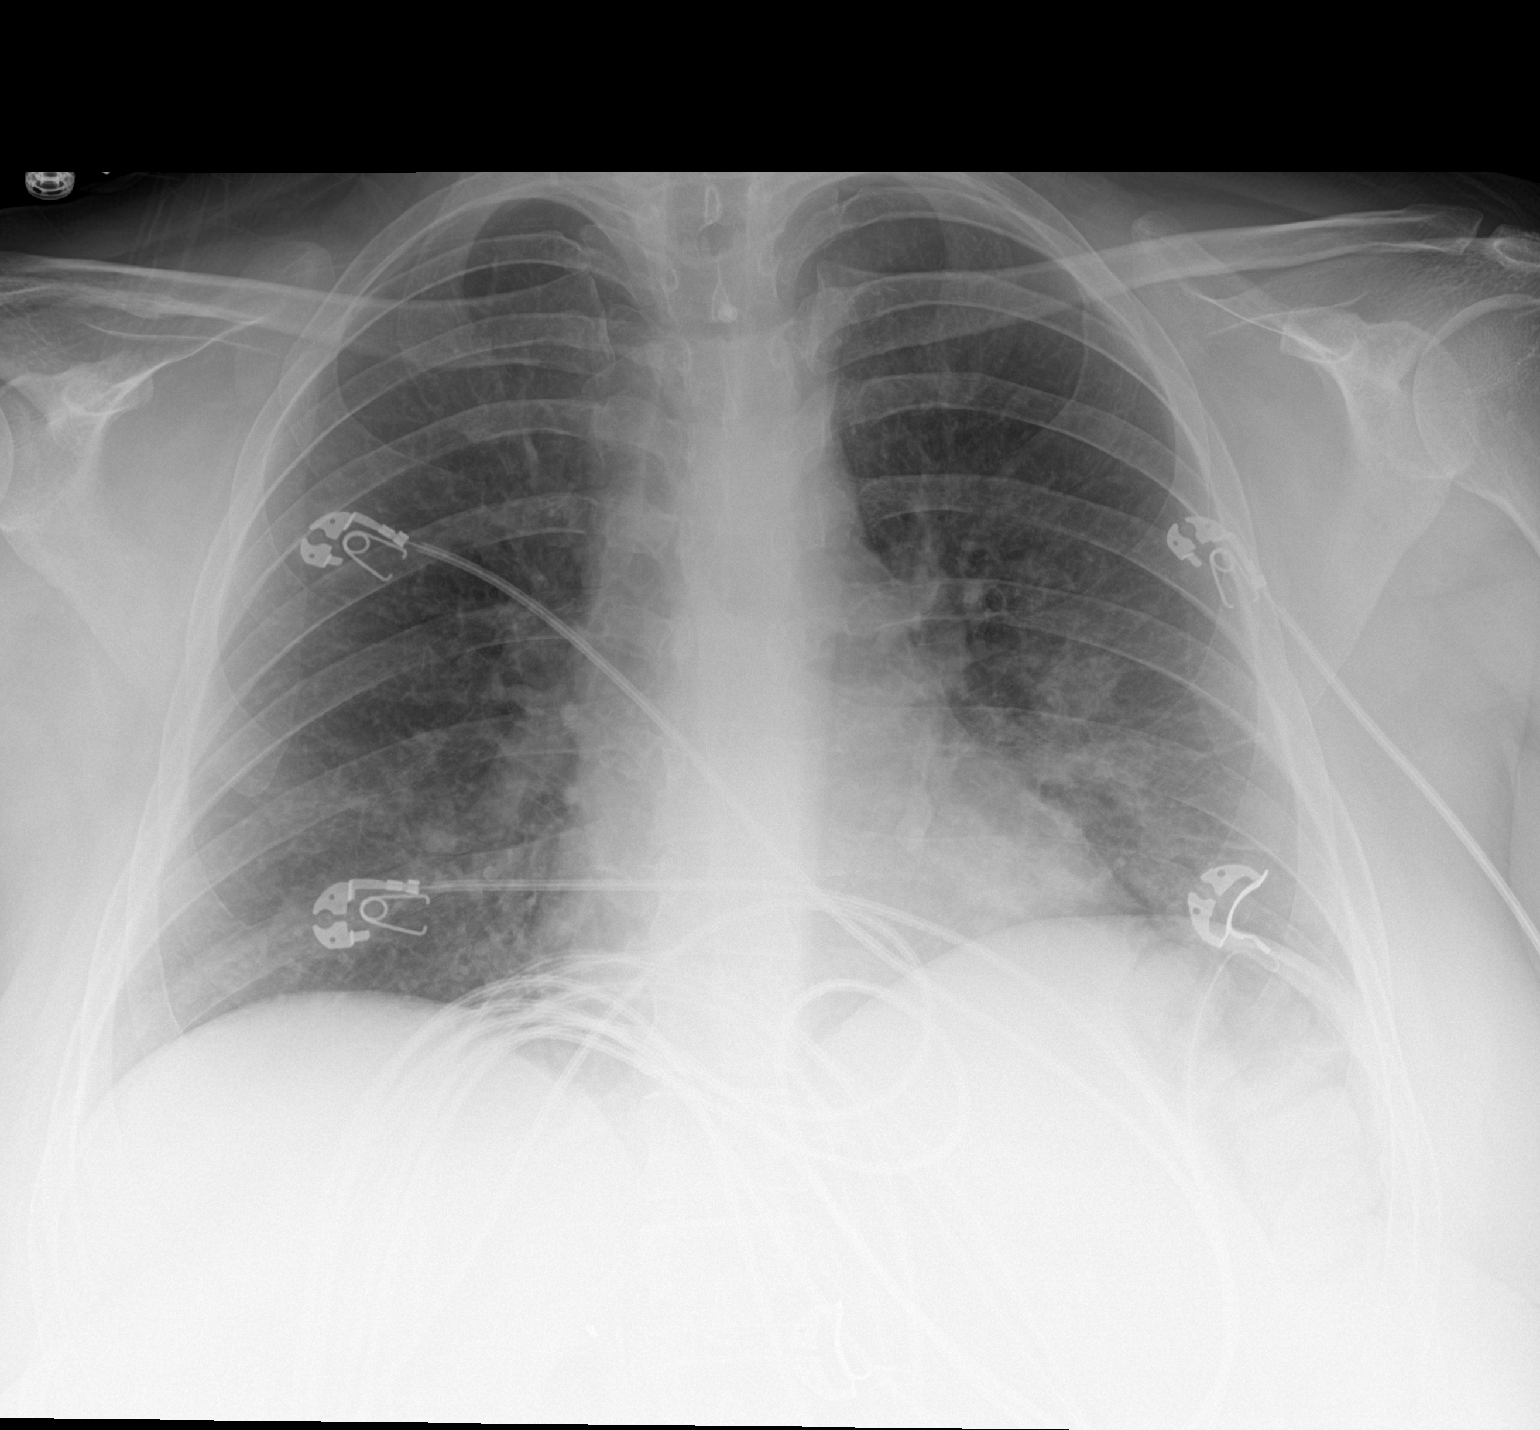

[1 of 1 positions shown; findings below may reference images not displayed]

FINDINGS: Cardiac shadow is within normal limits. The lungs are well aerated
bilaterally. Patchy basilar infiltrate is noted on the left but
significantly improved when compared with the prior exam. No
effusion is seen. No bony abnormality is noted.
IMPRESSION: Patchy infiltrate in the left base significantly improved when
compared with the prior exam.
